# Patient Record
Sex: Female | Born: 1975 | Race: White | Hispanic: No | Marital: Single | Smoking: Never smoker
Health system: Southern US, Community
[De-identification: ages and names within clinical notes are randomized; demographics above are authoritative.]

## PROBLEM LIST (undated history)

## (undated) DIAGNOSIS — B182 Chronic viral hepatitis C: Secondary | ICD-10-CM

## (undated) HISTORY — PX: TUBAL LIGATION: SHX77

---

## 1999-01-28 ENCOUNTER — Inpatient Hospital Stay (HOSPITAL_COMMUNITY): Admission: AD | Admit: 1999-01-28 | Discharge: 1999-01-28 | Payer: Self-pay | Admitting: Obstetrics

## 1999-02-21 ENCOUNTER — Ambulatory Visit (HOSPITAL_COMMUNITY): Admission: RE | Admit: 1999-02-21 | Discharge: 1999-02-21 | Payer: Self-pay | Admitting: Obstetrics

## 1999-06-05 ENCOUNTER — Inpatient Hospital Stay (HOSPITAL_COMMUNITY): Admission: AD | Admit: 1999-06-05 | Discharge: 1999-06-05 | Payer: Self-pay | Admitting: *Deleted

## 1999-06-06 ENCOUNTER — Inpatient Hospital Stay (HOSPITAL_COMMUNITY): Admission: AD | Admit: 1999-06-06 | Discharge: 1999-06-06 | Payer: Self-pay | Admitting: *Deleted

## 1999-06-07 ENCOUNTER — Inpatient Hospital Stay (HOSPITAL_COMMUNITY): Admission: AD | Admit: 1999-06-07 | Discharge: 1999-06-07 | Payer: Self-pay | Admitting: Obstetrics & Gynecology

## 1999-06-07 ENCOUNTER — Encounter: Payer: Self-pay | Admitting: Obstetrics & Gynecology

## 1999-06-14 ENCOUNTER — Inpatient Hospital Stay (HOSPITAL_COMMUNITY): Admission: AD | Admit: 1999-06-14 | Discharge: 1999-06-14 | Payer: Self-pay | Admitting: Obstetrics & Gynecology

## 1999-06-22 ENCOUNTER — Inpatient Hospital Stay (HOSPITAL_COMMUNITY): Admission: AD | Admit: 1999-06-22 | Discharge: 1999-06-22 | Payer: Self-pay | Admitting: Obstetrics

## 1999-07-10 ENCOUNTER — Inpatient Hospital Stay (HOSPITAL_COMMUNITY): Admission: AD | Admit: 1999-07-10 | Discharge: 1999-07-10 | Payer: Self-pay | Admitting: *Deleted

## 1999-07-22 ENCOUNTER — Inpatient Hospital Stay (HOSPITAL_COMMUNITY): Admission: AD | Admit: 1999-07-22 | Discharge: 1999-07-24 | Payer: Self-pay | Admitting: Obstetrics

## 1999-10-30 ENCOUNTER — Inpatient Hospital Stay (HOSPITAL_COMMUNITY): Admission: AD | Admit: 1999-10-30 | Discharge: 1999-10-30 | Payer: Self-pay | Admitting: *Deleted

## 2014-07-26 ENCOUNTER — Encounter (HOSPITAL_COMMUNITY): Payer: Self-pay | Admitting: Emergency Medicine

## 2014-07-26 ENCOUNTER — Emergency Department (HOSPITAL_COMMUNITY)
Admission: EM | Admit: 2014-07-26 | Discharge: 2014-07-26 | Disposition: A | Discharge status: Home / Self Care | Payer: Self-pay | Attending: Emergency Medicine | Admitting: Emergency Medicine

## 2014-07-26 DIAGNOSIS — Z791 Long term (current) use of non-steroidal anti-inflammatories (NSAID): Secondary | ICD-10-CM | POA: Insufficient documentation

## 2014-07-26 DIAGNOSIS — R21 Rash and other nonspecific skin eruption: Secondary | ICD-10-CM | POA: Insufficient documentation

## 2014-07-26 DIAGNOSIS — G44209 Tension-type headache, unspecified, not intractable: Secondary | ICD-10-CM | POA: Insufficient documentation

## 2014-07-26 DIAGNOSIS — Z3202 Encounter for pregnancy test, result negative: Secondary | ICD-10-CM | POA: Insufficient documentation

## 2014-07-26 DIAGNOSIS — R5381 Other malaise: Secondary | ICD-10-CM | POA: Insufficient documentation

## 2014-07-26 DIAGNOSIS — Z8619 Personal history of other infectious and parasitic diseases: Secondary | ICD-10-CM | POA: Insufficient documentation

## 2014-07-26 DIAGNOSIS — R112 Nausea with vomiting, unspecified: Secondary | ICD-10-CM | POA: Insufficient documentation

## 2014-07-26 DIAGNOSIS — R42 Dizziness and giddiness: Secondary | ICD-10-CM | POA: Insufficient documentation

## 2014-07-26 DIAGNOSIS — R109 Unspecified abdominal pain: Secondary | ICD-10-CM | POA: Insufficient documentation

## 2014-07-26 DIAGNOSIS — R5383 Other fatigue: Secondary | ICD-10-CM

## 2014-07-26 DIAGNOSIS — N949 Unspecified condition associated with female genital organs and menstrual cycle: Secondary | ICD-10-CM | POA: Insufficient documentation

## 2014-07-26 HISTORY — DX: Chronic viral hepatitis C: B18.2

## 2014-07-26 LAB — URINALYSIS, ROUTINE W REFLEX MICROSCOPIC
Glucose, UA: NEGATIVE mg/dL
Hgb urine dipstick: NEGATIVE
Ketones, ur: 15 mg/dL — AB
Nitrite: NEGATIVE
Protein, ur: 30 mg/dL — AB
Specific Gravity, Urine: 1.025 (ref 1.005–1.030)
Urobilinogen, UA: 0.2 mg/dL (ref 0.0–1.0)
pH: 6 (ref 5.0–8.0)

## 2014-07-26 LAB — CBC WITH DIFFERENTIAL/PLATELET
Basophils Absolute: 0.1 10*3/uL (ref 0.0–0.1)
Basophils Relative: 2 % — ABNORMAL HIGH (ref 0–1)
Eosinophils Absolute: 0 10*3/uL (ref 0.0–0.7)
Eosinophils Relative: 0 % (ref 0–5)
HCT: 39.5 % (ref 36.0–46.0)
Hemoglobin: 13.8 g/dL (ref 12.0–15.0)
Lymphocytes Relative: 43 % (ref 12–46)
Lymphs Abs: 2.9 10*3/uL (ref 0.7–4.0)
MCH: 29.2 pg (ref 26.0–34.0)
MCHC: 34.9 g/dL (ref 30.0–36.0)
MCV: 83.7 fL (ref 78.0–100.0)
Monocytes Absolute: 0.5 10*3/uL (ref 0.1–1.0)
Monocytes Relative: 7 % (ref 3–12)
Neutro Abs: 3.4 10*3/uL (ref 1.7–7.7)
Neutrophils Relative %: 48 % (ref 43–77)
Platelets: 199 10*3/uL (ref 150–400)
RBC: 4.72 MIL/uL (ref 3.87–5.11)
RDW: 13.5 % (ref 11.5–15.5)
WBC: 6.8 10*3/uL (ref 4.0–10.5)

## 2014-07-26 LAB — COMPREHENSIVE METABOLIC PANEL
ALT: 23 U/L (ref 0–35)
AST: 30 U/L (ref 0–37)
Albumin: 3.7 g/dL (ref 3.5–5.2)
Alkaline Phosphatase: 86 U/L (ref 39–117)
Anion gap: 14 (ref 5–15)
BUN: 8 mg/dL (ref 6–23)
CO2: 25 mEq/L (ref 19–32)
Calcium: 8.4 mg/dL (ref 8.4–10.5)
Chloride: 101 mEq/L (ref 96–112)
Creatinine, Ser: 0.78 mg/dL (ref 0.50–1.10)
GFR calc Af Amer: >90 mL/min (ref >90)
GFR calc non Af Amer: >90 mL/min (ref >90)
Glucose, Bld: 97 mg/dL (ref 70–99)
Potassium: 3.7 mEq/L (ref 3.7–5.3)
Sodium: 140 mEq/L (ref 137–147)
Total Bilirubin: 0.7 mg/dL (ref 0.3–1.2)
Total Protein: 7.6 g/dL (ref 6.0–8.3)

## 2014-07-26 LAB — URINE MICROSCOPIC-ADD ON

## 2014-07-26 LAB — POC URINE PREG, ED: Preg Test, Ur: NEGATIVE

## 2014-07-26 LAB — LIPASE, BLOOD: Lipase: 23 U/L (ref 11–59)

## 2014-07-26 MED ORDER — MORPHINE SULFATE 4 MG/ML IJ SOLN
4.0000 mg | Freq: Once | INTRAMUSCULAR | Status: AC
  Administered 2014-07-26: 4 mg via INTRAVENOUS
  Filled 2014-07-26: qty 1

## 2014-07-26 MED ORDER — KETOROLAC TROMETHAMINE 30 MG/ML IJ SOLN
30.0000 mg | Freq: Once | INTRAMUSCULAR | Status: AC
  Administered 2014-07-26: 30 mg via INTRAVENOUS
  Filled 2014-07-26: qty 1

## 2014-07-26 MED ORDER — PROMETHAZINE HCL 25 MG PO TABS: 25.0000 mg | ORAL_TABLET | Freq: Four times a day (QID) | ORAL | Status: DC | PRN

## 2014-07-26 MED ORDER — METOCLOPRAMIDE HCL 5 MG/ML IJ SOLN
10.0000 mg | Freq: Once | INTRAMUSCULAR | Status: AC
  Administered 2014-07-26: 10 mg via INTRAVENOUS
  Filled 2014-07-26: qty 2

## 2014-07-26 MED ORDER — SODIUM CHLORIDE 0.9 % IV BOLUS (SEPSIS)
1000.0000 mL | Freq: Once | INTRAVENOUS | Status: AC
  Administered 2014-07-26: 1000 mL via INTRAVENOUS

## 2014-07-26 MED ORDER — ONDANSETRON HCL 4 MG/2ML IJ SOLN
4.0000 mg | Freq: Once | INTRAMUSCULAR | Status: AC
  Administered 2014-07-26: 4 mg via INTRAVENOUS
  Filled 2014-07-26: qty 2

## 2014-07-26 NOTE — ED Notes (Signed)
IV attempt x 2; second RN attempting

## 2014-07-26 NOTE — ED Notes (Addendum)
Pt c/o lower abdominal pain and NV x 3 days. Reports having menstral period but pain is different. Reports "too many times to count" of emesis; unable to keep food or fluids down. 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 Also c/o persistent frontal headache x 3 days. Rash also noted to centralized chest which started 3 days ago.

## 2014-07-26 NOTE — Progress Notes (Signed)
[facility] Community Liaison Stacy, ° °Will send patient a list of primary care resources and information on [facility] Card program to help patient establish primary care.  °

## 2014-07-26 NOTE — ED Notes (Addendum)
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

## 2014-07-26 NOTE — Discharge Instructions (Signed)
Phenergan for nausea as prescribed as needed. This medication may help you with headache. Follow up with primary care doctor for recheck. Return if worsening.    General Headache Without Cause A headache is pain or discomfort felt around the head or neck area. The specific cause of a headache may not be found. There are many causes and types of headaches. A few common ones are:  Tension headaches.  Migraine headaches.  Cluster headaches.  Chronic daily headaches. HOME CARE INSTRUCTIONS   Keep all follow-up appointments with your caregiver or any specialist referral.  Only take over-the-counter or prescription medicines for pain or discomfort as directed by your caregiver.  Lie down in a dark, quiet room when you have a headache.  Keep a headache journal to find out what may trigger your migraine headaches. For example, write down:  What you eat and drink.  How much sleep you get.  Any change to your diet or medicines.  Try massage or other relaxation techniques.  Put ice packs or heat on the head and neck. Use these 3 to 4 times per day for 15 to 20 minutes each time, or as needed.  Limit stress.  Sit up straight, and do not tense your muscles.  Quit smoking if you smoke.  Limit alcohol use.  Decrease the amount of caffeine you drink, or stop drinking caffeine.  Eat and sleep on a regular schedule.  Get 7 to 9 hours of sleep, or as recommended by your caregiver.  Keep lights dim if bright lights bother you and make your headaches worse. SEEK MEDICAL CARE IF:   You have problems with the medicines you were prescribed.  Your medicines are not working.  You have a change from the usual headache.  You have nausea or vomiting. SEEK IMMEDIATE MEDICAL CARE IF:   Your headache becomes severe.  You have a fever.  You have a stiff neck.  You have loss of vision.  You have muscular weakness or loss of muscle control.  You start losing your balance or have  trouble walking.  You feel faint or pass out.  You have severe symptoms that are different from your first symptoms. MAKE SURE YOU:   Understand these instructions.  Will watch your condition.  Will get help right away if you are not doing well or get worse. Document Released: 12/02/2005 Document Revised: 02/24/2012 Document Reviewed: 12/18/2011 [facility] Patient Information 2015 [place], [place]. This information is not intended to replace advice given to you by your health care provider. Make sure you discuss any questions you have with your health care provider.  Nausea and Vomiting Nausea is a sick feeling that often comes before throwing up (vomiting). Vomiting is a reflex where stomach contents come out of your mouth. Vomiting can cause severe loss of body fluids (dehydration). Children and elderly adults can become dehydrated quickly, especially if they also have diarrhea. Nausea and vomiting are symptoms of a condition or disease. It is important to find the cause of your symptoms. CAUSES   Direct irritation of the stomach lining. This irritation can result from increased acid production (gastroesophageal reflux disease), infection, food poisoning, taking certain medicines (such as nonsteroidal anti-inflammatory drugs), alcohol use, or tobacco use.  Signals from the brain.These signals could be caused by a headache, heat exposure, an inner ear disturbance, increased pressure in the brain from injury, infection, a tumor, or a concussion, pain, emotional stimulus, or metabolic problems.  An obstruction in the gastrointestinal tract (bowel obstruction).  Illnesses such as diabetes, hepatitis, gallbladder problems, appendicitis, kidney problems, cancer, sepsis, atypical symptoms of a heart attack, or eating disorders.  Medical treatments such as chemotherapy and radiation.  Receiving medicine that makes you sleep (general anesthetic) during surgery. DIAGNOSIS Your caregiver may ask  for tests to be done if the problems do not improve after a few days. Tests may also be done if symptoms are severe or if the reason for the nausea and vomiting is not clear. Tests may include:  Urine tests.  Blood tests.  Stool tests.  Cultures (to look for evidence of infection).  X-rays or other imaging studies. Test results can help your caregiver make decisions about treatment or the need for additional tests. TREATMENT You need to stay well hydrated. Drink frequently but in small amounts.You may wish to drink water, sports drinks, clear broth, or eat frozen ice pops or gelatin dessert to help stay hydrated.When you eat, eating slowly may help prevent nausea.There are also some antinausea medicines that may help prevent nausea. HOME CARE INSTRUCTIONS   Take all medicine as directed by your caregiver.  If you do not have an appetite, do not force yourself to eat. However, you must continue to drink fluids.  If you have an appetite, eat a normal diet unless your caregiver tells you differently.  Eat a variety of complex carbohydrates (rice, wheat, potatoes, bread), lean meats, yogurt, fruits, and vegetables.  Avoid high-fat foods because they are more difficult to digest.  Drink enough water and fluids to keep your urine clear or pale yellow.  If you are dehydrated, ask your caregiver for specific rehydration instructions. Signs of dehydration may include:  Severe thirst.  Dry lips and mouth.  Dizziness.  Dark urine.  Decreasing urine frequency and amount.  Confusion.  Rapid breathing or pulse. SEEK IMMEDIATE MEDICAL CARE IF:   You have blood or brown flecks (like coffee grounds) in your vomit.  You have black or bloody stools.  You have a severe headache or stiff neck.  You are confused.  You have severe abdominal pain.  You have chest pain or trouble breathing.  You do not urinate at least once every 8 hours.  You develop cold or clammy skin.  You  continue to vomit for longer than 24 to 48 hours.  You have a fever. MAKE SURE YOU:   Understand these instructions.  Will watch your condition.  Will get help right away if you are not doing well or get worse. Document Released: 12/02/2005 Document Revised: 02/24/2012 Document Reviewed: 05/01/2011 [facility] Patient Information 2015 [place]. This information is not intended to replace advice given to you by your health care provider. Make sure you discuss any questions you have with your health care provider.

## 2014-07-26 NOTE — ED Provider Notes (Signed)
CSN: [id number]     Arrival date & time 07/26/14  1320 History   First MD Initiated Contact with Patient 07/26/14 1423     Chief Complaint  Patient presents with  . Rash  . Emesis     (Consider location/radiation/quality/duration/timing/severity/associated sxs/prior Treatment) HPI Tara May is a 38 y.o. female who presents to ED with complaint of fatigue, nausea, vomiting, headache, weakness, rash to the chest for 3 days. States no hx of the same. No one in family is ill with the same. Denies fever. States unable to keep anything down. She states she has had rash on the chest for a little longer, has been putting hydrocortisone cream with some improvement. States she was also recently told there is mold in her office and she believes this could be what is causing her symptoms. She denies any nasal congestion, sore throat, cough, respiratory problems. States she is having lower abdominal cramping, but states that she started her period yesterday. Denies any urinary symptoms. No treatments tried. No other complaints.    Past Medical History  Diagnosis Date  . Hep C w/o coma, chronic    History reviewed. No pertinent past surgical history. History reviewed. No pertinent family history. History  Substance Use Topics  . Smoking status: Never Smoker   . Smokeless tobacco: Not on file  . Alcohol Use: No   OB History   Grav Para Term Preterm Abortions TAB SAB Ect Mult Living                 Review of Systems  Constitutional: Positive for fatigue. Negative for fever and chills.  HENT: Negative for congestion, sore throat and trouble swallowing.   Respiratory: Negative for cough, chest tightness and shortness of breath.   Cardiovascular: Negative for chest pain, palpitations and leg swelling.  Gastrointestinal: Positive for nausea, vomiting and abdominal pain. Negative for diarrhea.  Genitourinary: Positive for pelvic pain. Negative for dysuria, flank pain, vaginal bleeding, vaginal  discharge and vaginal pain.  Musculoskeletal: Negative for arthralgias, myalgias, neck pain and neck stiffness.  Skin: Negative for rash.  Neurological: Positive for weakness, light-headedness and headaches. Negative for dizziness and numbness.  All other systems reviewed and are negative.     Allergies  Review of patient's allergies indicates no known allergies.  Home Medications   Prior to Admission medications   Medication Sig Start Date End Date Taking? Authorizing Provider  ibuprofen (ADVIL,MOTRIN) 200 MG tablet Take 400 mg by mouth every 6 (six) hours as needed for moderate pain.   Yes Historical Provider, MD   BP 133/84  Pulse 106  Temp(Src) 98.5 F (36.9 C) (Oral)  Resp 18  Ht 5' (1.524 m)  Wt 155 lb (70.308 kg)  BMI 30.27 kg/m2  SpO2 95% Physical Exam  Nursing note and vitals reviewed. Constitutional: She appears well-developed and well-nourished. No distress.  HENT:  Head: Normocephalic.  Nose: Nose normal.  Mouth/Throat: Oropharynx is clear and moist.  Eyes: Conjunctivae and EOM are normal. Pupils are equal, round, and reactive to light.  Neck: Normal range of motion. Neck supple.  No meningismus  Cardiovascular: Normal rate, regular rhythm and normal heart sounds.   Pulmonary/Chest: Effort normal and breath sounds normal. No respiratory distress. She has no wheezes. She has no rales.  Abdominal: Soft. Bowel sounds are normal. She exhibits no distension. There is no tenderness. There is no rebound and no guarding.  Musculoskeletal: She exhibits no edema.  Neurological: She is alert.  Skin: Skin is  warm and dry.  Erythematous pappular rash to the anterior neck, upper chest, and under bilateral breast.   Psychiatric: She has a normal mood and affect. Her behavior is normal.    ED Course  Procedures (including critical care time) Labs Review Labs Reviewed  CBC WITH DIFFERENTIAL - Abnormal; Notable for the following:    Basophils Relative 2 (*)    All other  components within normal limits  URINALYSIS, ROUTINE W REFLEX MICROSCOPIC - Abnormal; Notable for the following:    Color, Urine AMBER (*)    Bilirubin Urine SMALL (*)    Ketones, ur 15 (*)    Protein, ur 30 (*)    Leukocytes, UA TRACE (*)    All other components within normal limits  COMPREHENSIVE METABOLIC PANEL  LIPASE, BLOOD  URINE MICROSCOPIC-ADD ON  POC URINE PREG, ED    Imaging Review No results found.   EKG Interpretation None      MDM   Final diagnoses:  Tension-type headache, not intractable, unspecified chronicity pattern  Non-intractable vomiting with nausea, vomiting of unspecified type   Pt with headache, nausea, vomiting, lower abdominal cramping. Also rash to the chest. Rash consistent with dermatitis possibly due to sweat? She denies any new jewelry or products. Pt has no abdominal tenderness on exam. Will get labs, start iv fluids, zofran.   4:32 PM No headache improvement with toradol. Nausea improved with zofran. Will try morphine and reglan. Pt receiving iv fluids. Will reassess.  5:13 PM Pt feeling slightly improved. On exam, no signs of meningismus. No neck pain or stiffness. She is tolerating POs. Home with phenergan and follow up. Suspect most likely viral gastroenteritis.  VS normal. Stable for d/c home.   Filed Vitals:   07/26/14 1325 07/26/14 1516 07/26/14 1645  BP: 133/84 110/73 108/59  Pulse: 106 89 84  Temp: 98.5 F (36.9 C)    TempSrc: Oral    Resp: 18 18 16   Height: 5' (1.524 m)    Weight: 155 lb (70.308 kg)    SpO2: 95% 96% 97%     Lottie Mussel, PA-C 07/26/14 1715

## 2014-07-28 NOTE — ED Provider Notes (Signed)
Medical screening examination/treatment/procedure(s) were performed by non-physician practitioner and as supervising physician I was immediately available for consultation/collaboration.   EKG Interpretation None       Giovanny Dugal, MD 07/28/14 0704 

## 2014-08-01 ENCOUNTER — Encounter (HOSPITAL_COMMUNITY): Payer: Self-pay | Admitting: Emergency Medicine

## 2014-08-01 ENCOUNTER — Emergency Department (HOSPITAL_COMMUNITY): Payer: Self-pay

## 2014-08-01 ENCOUNTER — Emergency Department (HOSPITAL_COMMUNITY)
Admission: EM | Admit: 2014-08-01 | Discharge: 2014-08-02 | Disposition: A | Discharge status: Home / Self Care | Payer: Self-pay | Attending: Emergency Medicine | Admitting: Emergency Medicine

## 2014-08-01 DIAGNOSIS — J3489 Other specified disorders of nose and nasal sinuses: Secondary | ICD-10-CM | POA: Insufficient documentation

## 2014-08-01 DIAGNOSIS — Z3202 Encounter for pregnancy test, result negative: Secondary | ICD-10-CM | POA: Insufficient documentation

## 2014-08-01 DIAGNOSIS — R112 Nausea with vomiting, unspecified: Secondary | ICD-10-CM | POA: Insufficient documentation

## 2014-08-01 DIAGNOSIS — Z8719 Personal history of other diseases of the digestive system: Secondary | ICD-10-CM | POA: Insufficient documentation

## 2014-08-01 DIAGNOSIS — Z8619 Personal history of other infectious and parasitic diseases: Secondary | ICD-10-CM | POA: Insufficient documentation

## 2014-08-01 DIAGNOSIS — Z79899 Other long term (current) drug therapy: Secondary | ICD-10-CM | POA: Insufficient documentation

## 2014-08-01 DIAGNOSIS — R519 Headache, unspecified: Secondary | ICD-10-CM

## 2014-08-01 DIAGNOSIS — R51 Headache: Secondary | ICD-10-CM | POA: Insufficient documentation

## 2014-08-01 DIAGNOSIS — R21 Rash and other nonspecific skin eruption: Secondary | ICD-10-CM | POA: Insufficient documentation

## 2014-08-01 DIAGNOSIS — R630 Anorexia: Secondary | ICD-10-CM | POA: Insufficient documentation

## 2014-08-01 DIAGNOSIS — M542 Cervicalgia: Secondary | ICD-10-CM | POA: Insufficient documentation

## 2014-08-01 DIAGNOSIS — IMO0001 Reserved for inherently not codable concepts without codable children: Secondary | ICD-10-CM | POA: Insufficient documentation

## 2014-08-01 LAB — COMPREHENSIVE METABOLIC PANEL
ALK PHOS: 108 U/L (ref 39–117)
ALT: 39 U/L — AB (ref 0–35)
AST: 48 U/L — AB (ref 0–37)
Albumin: 2.9 g/dL — ABNORMAL LOW (ref 3.5–5.2)
Anion gap: 13 (ref 5–15)
BILIRUBIN TOTAL: 0.5 mg/dL (ref 0.3–1.2)
BUN: 7 mg/dL (ref 6–23)
CHLORIDE: 100 meq/L (ref 96–112)
CO2: 23 meq/L (ref 19–32)
Calcium: 8.3 mg/dL — ABNORMAL LOW (ref 8.4–10.5)
Creatinine, Ser: 0.78 mg/dL (ref 0.50–1.10)
GLUCOSE: 107 mg/dL — AB (ref 70–99)
Potassium: 4 mEq/L (ref 3.7–5.3)
SODIUM: 136 meq/L — AB (ref 137–147)
Total Protein: 6.5 g/dL (ref 6.0–8.3)

## 2014-08-01 LAB — CBC WITH DIFFERENTIAL/PLATELET
BASOS PCT: 3 % — AB (ref 0–1)
Basophils Absolute: 0.4 10*3/uL — ABNORMAL HIGH (ref 0.0–0.1)
EOS PCT: 0 % (ref 0–5)
Eosinophils Absolute: 0 10*3/uL (ref 0.0–0.7)
HCT: 38.5 % (ref 36.0–46.0)
Hemoglobin: 13.3 g/dL (ref 12.0–15.0)
Lymphocytes Relative: 62 % — ABNORMAL HIGH (ref 12–46)
Lymphs Abs: 7.9 10*3/uL — ABNORMAL HIGH (ref 0.7–4.0)
MCH: 29.3 pg (ref 26.0–34.0)
MCHC: 34.5 g/dL (ref 30.0–36.0)
MCV: 84.8 fL (ref 78.0–100.0)
MONOS PCT: 9 % (ref 3–12)
Monocytes Absolute: 1.2 10*3/uL — ABNORMAL HIGH (ref 0.1–1.0)
NEUTROS PCT: 26 % — AB (ref 43–77)
Neutro Abs: 3.4 10*3/uL (ref 1.7–7.7)
PLATELETS: 231 10*3/uL (ref 150–400)
RBC: 4.54 MIL/uL (ref 3.87–5.11)
RDW: 14.5 % (ref 11.5–15.5)
WBC: 12.9 10*3/uL — AB (ref 4.0–10.5)

## 2014-08-01 LAB — I-STAT CG4 LACTIC ACID, ED: Lactic Acid, Venous: 1.11 mmol/L (ref 0.5–2.2)

## 2014-08-01 MED ORDER — SODIUM CHLORIDE 0.9 % IV BOLUS (SEPSIS)
1000.0000 mL | Freq: Once | INTRAVENOUS | Status: AC
  Administered 2014-08-02: 1000 mL via INTRAVENOUS

## 2014-08-01 MED ORDER — SODIUM CHLORIDE 0.9 % IV BOLUS (SEPSIS)
1000.0000 mL | Freq: Once | INTRAVENOUS | Status: AC
  Administered 2014-08-01: 1000 mL via INTRAVENOUS

## 2014-08-01 MED ORDER — DIPHENHYDRAMINE HCL 50 MG/ML IJ SOLN
25.0000 mg | Freq: Once | INTRAMUSCULAR | Status: AC
  Administered 2014-08-01: 25 mg via INTRAVENOUS
  Filled 2014-08-01: qty 1

## 2014-08-01 MED ORDER — ONDANSETRON HCL 4 MG/2ML IJ SOLN
4.0000 mg | Freq: Once | INTRAMUSCULAR | Status: AC
  Administered 2014-08-01: 4 mg via INTRAVENOUS
  Filled 2014-08-01: qty 2

## 2014-08-01 MED ORDER — METOCLOPRAMIDE HCL 5 MG/ML IJ SOLN
10.0000 mg | Freq: Once | INTRAMUSCULAR | Status: AC
  Administered 2014-08-01: 10 mg via INTRAVENOUS
  Filled 2014-08-01: qty 2

## 2014-08-01 NOTE — ED Notes (Signed)
Pt c/o headache, fever and rash to upper chest. Pt reports headache is in posterior head described as pressure. Pt states she had a rash on chest when she came to visit on 8/11 but has since gotten worse. Pt states she has been trying otc ibuprofen with no relief.

## 2014-08-01 NOTE — ED Notes (Signed)
Patient here with complaint of fever and headache which began about 10 days ago. States that she was seen for these symptoms about a week ago and was discharged with phenergan. States that the headache has been unrelenting since that time and her fevers have continued.

## 2014-08-01 NOTE — ED Provider Notes (Signed)
MSE was initiated and I personally evaluated the patient and placed orders (if any) at  10:32 PM on August 01, 2014.  The patient appears stable so that the remainder of the MSE may be completed by another provider.   Joya Gaskinsonald W [employer], MD 08/01/14 2232

## 2014-08-01 NOTE — ED Notes (Signed)
Patient transported to CT 

## 2014-08-01 NOTE — ED Provider Notes (Signed)
CSN: [id number]     Arrival date & time 08/01/14  1907 History   First MD Initiated Contact with Patient 08/01/14 2232     Chief Complaint  Patient presents with  . Fever  . Headache     (Consider location/radiation/quality/duration/timing/severity/associated sxs/prior Treatment) HPI Comments: Patient reports 10 day history of fever and headache. She was seen in the emergency department on August 11 with headache, rash, fever, nausea and vomiting and diagnosed with gastroenteritis. She reports the headache persists it is constant in the back of her head. Nothing makes it better or worse. She one episode of vomiting today fever up to 101. She denies any photophobia or phonophobia. Denies any cough, chest pain or shortness of breath. No abdominal pain or back pain. Denies any recent travel. Denies any recent camping trips or tick bites. 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 She endorses an itchy red rash on her chest between her breasts it is getting worse. No focal weakness, numbness or tingling. No visual changes.  She reports she returns today because the headache is persistent and the rash is getting worse. Denies any IV drug abuse.  The history is provided by the patient.    Past Medical History  Diagnosis Date  . Hep C w/o coma, chronic    History reviewed. No pertinent past surgical history. History reviewed. No pertinent family history. History  Substance Use Topics  . Smoking status: Never Smoker   . Smokeless tobacco: Not on file  . Alcohol Use: No   OB History   Grav Para Term Preterm Abortions TAB SAB Ect Mult Living                 Review of Systems  Constitutional: Positive for fever, activity change and appetite change. Negative for fatigue.  HENT: Positive for congestion.   Eyes: Negative for visual disturbance.  Respiratory: Negative for cough, chest tightness and shortness of breath.   Cardiovascular: Negative for chest pain.   Gastrointestinal: Positive for nausea and vomiting. Negative for abdominal pain.  Genitourinary: Negative for dysuria and hematuria.  Musculoskeletal: Positive for arthralgias, myalgias and neck pain.  Skin: Positive for rash.  Neurological: Positive for headaches. Negative for tremors and weakness.  A complete 10 system review of systems was obtained and all systems are negative except as noted in the HPI and PMH.      Allergies  Review of patient's allergies indicates no known allergies.  Home Medications   Prior to Admission medications   Medication Sig Start Date End Date Taking? Authorizing Provider  ibuprofen (ADVIL,MOTRIN) 200 MG tablet Take 400 mg by mouth every 6 (six) hours as needed for moderate pain.   Yes Historical Provider, MD  OVER THE COUNTER MEDICATION Take 1 tablet by mouth daily as needed (for headaches).   Yes Historical Provider, MD  promethazine (PHENERGAN) 25 MG tablet Take 1 tablet (25 mg total) by mouth every 6 (six) hours as needed for nausea or vomiting. 07/26/14  Yes Tatyana A Kirichenko, PA-C  doxycycline (VIBRAMYCIN) 100 MG capsule Take 1 capsule (100 mg total) by mouth 2 (two) times daily. 08/02/14   Glynn Octave, MD  hydrocortisone cream 1 % Apply to affected area 2 times daily 08/02/14   Glynn Octave, MD  ondansetron (ZOFRAN) 4 MG tablet Take 1 tablet (4 mg total) by mouth every 6 (six) hours. 08/02/14   Glynn Octave, MD   BP 107/62  Pulse 100  Temp(Src) 99.1 F (37.3  C) (Oral)  Resp 24  Ht 5' (1.524 m)  Wt 150 lb (68.04 kg)  BMI 29.30 kg/m2  SpO2 97%  LMP 07/25/2014 Physical Exam  Nursing note and vitals reviewed. Constitutional: She is oriented to person, place, and time. She appears well-developed and well-nourished. No distress.  Dry mucous membranes  HENT:  Head: Normocephalic and atraumatic.  Mouth/Throat: Oropharynx is clear and moist. No oropharyngeal exudate.  Eyes: Conjunctivae and EOM are normal. Pupils are equal, round, and  reactive to light.  Neck: Normal range of motion.  Stiffness with ROM of neck.  Cardiovascular: Normal rate, normal heart sounds and intact distal pulses.   No murmur heard. Pulmonary/Chest: Effort normal and breath sounds normal. No respiratory distress.  Abdominal: Soft. There is no tenderness. There is no rebound and no guarding.  Musculoskeletal: Normal range of motion. She exhibits no edema and no tenderness.  Neurological: She is alert and oriented to person, place, and time. No cranial nerve deficit. She exhibits normal muscle tone. Coordination normal.  No ataxia on finger to nose bilaterally. No pronator drift. 5/5 strength throughout. CN 2-12 intact. Negative Romberg. Equal grip strength. Sensation intact. Gait is normal.   Skin: Skin is warm. Rash noted.  Maculopapular rash between breasts with excoriations.  No rash to palms, soles, genitals  Psychiatric: She has a normal mood and affect. Her behavior is normal.    ED Course  Procedures (including critical care time) Labs Review Labs Reviewed  CBC WITH DIFFERENTIAL - Abnormal; Notable for the following:    WBC 12.9 (*)    Neutrophils Relative % 26 (*)    Lymphocytes Relative 62 (*)    Basophils Relative 3 (*)    Lymphs Abs 7.9 (*)    Monocytes Absolute 1.2 (*)    Basophils Absolute 0.4 (*)    All other components within normal limits  COMPREHENSIVE METABOLIC PANEL - Abnormal; Notable for the following:    Sodium 136 (*)    Glucose, Bld 107 (*)    Calcium 8.3 (*)    Albumin 2.9 (*)    AST 48 (*)    ALT 39 (*)    All other components within normal limits  CSF CELL COUNT WITH DIFFERENTIAL - Abnormal; Notable for the following:    RBC Count, CSF 1 (*)    All other components within normal limits  CSF CELL COUNT WITH DIFFERENTIAL - Abnormal; Notable for the following:    RBC Count, CSF 2 (*)    All other components within normal limits  GRAM STAIN  CULTURE, BLOOD (ROUTINE X 2)  CULTURE, BLOOD (ROUTINE X 2)  CSF  CULTURE  PREGNANCY, URINE  PROTEIN AND GLUCOSE, CSF  URINALYSIS, ROUTINE W REFLEX MICROSCOPIC  HERPES SIMPLEX VIRUS(HSV) DNA BY PCR  I-STAT CG4 LACTIC ACID, ED  I-STAT CG4 LACTIC ACID, ED    Imaging Review Dg Chest 2 View  08/01/2014   CLINICAL DATA:  Fever and headache.  EXAM: CHEST  2 VIEW  COMPARISON:  None.  FINDINGS: There is a linear opacity in the lingula consistent with atelectasis. Increased markings in the left lower lobe which are more indistinct, with possible air bronchogram. Suspect trace left pleural effusion based on posterior costophrenic sulcus blunting. The right chest is normal. Normal heart size upper mediastinal contours.  IMPRESSION: Left basilar atelectasis.  There may be associated bronchopneumonia.   Electronically Signed   By: Tiburcio PeaJonathan  Watts M.D.   On: 08/01/2014 23:44   Ct Head Wo Contrast  08/01/2014  CLINICAL DATA:  Fever and headache  EXAM: CT HEAD WITHOUT CONTRAST  TECHNIQUE: Contiguous axial images were obtained from the base of the skull through the vertex without intravenous contrast.  COMPARISON:  None.  FINDINGS: Skull and Sinuses:Negative for fracture or destructive process. The mastoids, middle ears, and imaged paranasal sinuses are clear.  Orbits: No acute abnormality.  Brain: No evidence of acute abnormality, such as acute infarction, hemorrhage, hydrocephalus, or mass lesion/mass effect.  IMPRESSION: Negative head CT.   Electronically Signed   By: Tiburcio Pea M.D.   On: 08/01/2014 23:42     EKG Interpretation None      MDM   Final diagnoses:  Headache, unspecified headache type   Ten day history of fever, headache and rash. Patient states headache is worsening and rash is worsening to chest. No new exposures.  Nonfocal neurological exam. No frank meningismus. CT head negative.  Rash is nonspecific in appearance. Does not appear to be meningococcemia or tick exposure.  LP results negative. No evidence of meningitis. Cultures  pending.  Headache improved with treatment in the ED. Heart rate improved to 100. Patient tolerating by mouth and ambulatory.  No vomiting in the ED. Questionable infiltrate on chest x-ray. Patient does endorse cough. We'll cover for possible community acquired pneumonia  With doxycycline. This should cover any possible tickborne illness as well.  Patient is tolerating by mouth. She is ambulatory. She is instructed to use hydrocortisone cream as needed for the nonspecific rash between her breasts. [facility]up with [facility]. Return precautions discussed.  BP 107/62  Pulse 100  Temp(Src) 99.1 F (37.3 C) (Oral)  Resp 24  Ht 5' (1.524 m)  Wt 150 lb (68.04 kg)  BMI 29.30 kg/m2  SpO2 97%  LMP 07/25/2014   Glynn Octave, MD 08/02/14 [phone]

## 2014-08-02 LAB — CSF CELL COUNT WITH DIFFERENTIAL
RBC COUNT CSF: 1 /mm3 — AB
RBC Count, CSF: 2 /mm3 — ABNORMAL HIGH
TUBE #: 1
TUBE #: 4
WBC, CSF: 2 /mm3 (ref 0–5)
WBC, CSF: 4 /mm3 (ref 0–5)

## 2014-08-02 LAB — URINE MICROSCOPIC-ADD ON

## 2014-08-02 LAB — PREGNANCY, URINE: Preg Test, Ur: NEGATIVE

## 2014-08-02 LAB — PROTEIN AND GLUCOSE, CSF
GLUCOSE CSF: 59 mg/dL (ref 43–76)
TOTAL PROTEIN, CSF: 22 mg/dL (ref 15–45)

## 2014-08-02 LAB — URINALYSIS, ROUTINE W REFLEX MICROSCOPIC
Glucose, UA: NEGATIVE mg/dL
Hgb urine dipstick: NEGATIVE
Ketones, ur: 15 mg/dL — AB
LEUKOCYTES UA: NEGATIVE
NITRITE: POSITIVE — AB
PH: 6 (ref 5.0–8.0)
Protein, ur: 30 mg/dL — AB
SPECIFIC GRAVITY, URINE: 1.02 (ref 1.005–1.030)
Urobilinogen, UA: 0.2 mg/dL (ref 0.0–1.0)

## 2014-08-02 LAB — HERPES SIMPLEX VIRUS(HSV) DNA BY PCR
HSV 1 DNA: NOT DETECTED
HSV 2 DNA: NOT DETECTED

## 2014-08-02 LAB — GRAM STAIN: Special Requests: NORMAL

## 2014-08-02 LAB — I-STAT CG4 LACTIC ACID, ED: LACTIC ACID, VENOUS: 1.42 mmol/L (ref 0.5–2.2)

## 2014-08-02 LAB — PATHOLOGIST SMEAR REVIEW

## 2014-08-02 MED ORDER — DOXYCYCLINE HYCLATE 100 MG PO CAPS: 100.0000 mg | ORAL_CAPSULE | Freq: Two times a day (BID) | ORAL | Status: DC

## 2014-08-02 MED ORDER — ONDANSETRON HCL 4 MG PO TABS: 4.0000 mg | ORAL_TABLET | Freq: Four times a day (QID) | ORAL | Status: DC

## 2014-08-02 MED ORDER — MORPHINE SULFATE 4 MG/ML IJ SOLN
4.0000 mg | Freq: Once | INTRAMUSCULAR | Status: AC
  Administered 2014-08-02: 4 mg via INTRAVENOUS
  Filled 2014-08-02: qty 1

## 2014-08-02 MED ORDER — HYDROCORTISONE 1 % EX CREA: TOPICAL_CREAM | CUTANEOUS | Status: DC

## 2014-08-02 MED ORDER — ONDANSETRON HCL 4 MG/2ML IJ SOLN
4.0000 mg | Freq: Once | INTRAMUSCULAR | Status: AC
  Administered 2014-08-02: 4 mg via INTRAVENOUS
  Filled 2014-08-02: qty 2

## 2014-08-02 NOTE — ED Provider Notes (Signed)
I saw and evaluated the patient, reviewed the resident's note and I agree with the findings and plan. If applicable, I agree with the resident's interpretation of the EKG.  If applicable, I was present for critical portions of any procedures performed.    Glynn OctaveStephen Mckensie Scotti, MD 08/02/14 [phone]

## 2014-08-02 NOTE — ED Provider Notes (Signed)
LUMBAR PUNCTURE Date/Time: 08/02/2014 12:11 AM Performed by: Gavin PoundBROOTEN, Ulices Maack Authorized by: Glynn OctaveANCOUR, STEPHEN Consent: Verbal consent obtained. written consent obtained. Risks and benefits: risks, benefits and alternatives were discussed Consent given by: patient Required items: required blood products, implants, devices, and special equipment available Patient identity confirmed: verbally with patient and arm band Time out: Immediately prior to procedure a "time out" was called to verify the correct patient, procedure, equipment, support staff and site/side marked as required. Indications: evaluation for infection Anesthesia: local infiltration Local anesthetic: lidocaine 1% without epinephrine Anesthetic total: 2 ml Preparation: Patient was prepped and draped in the usual sterile fashion. Lumbar space: L3-L4 interspace Patient's position: sitting Needle gauge: 22 Needle type: spinal needle - Quincke tip Needle length: 3.5 in Number of attempts: 1 Fluid appearance: clear Tubes of fluid: 4 Total volume: 4 ml Post-procedure: site cleaned and adhesive bandage applied Patient tolerance: Patient tolerated the procedure well with no immediate complications.    Gavin PoundJustin Noal Abshier, MD 08/02/14 [phone]

## 2014-08-02 NOTE — ED Notes (Signed)
Pt ambulated in hall with no assistance. Pt also was able to keep drink down.

## 2014-08-02 NOTE — Discharge Instructions (Signed)
General Headache Without Cause Follow up with the wellness center. Return to the ED if you develop new or worsening symptoms. A headache is pain or discomfort felt around the head or neck area. The specific cause of a headache may not be found. There are many causes and types of headaches. A few common ones are:  Tension headaches.  Migraine headaches.  Cluster headaches.  Chronic daily headaches. HOME CARE INSTRUCTIONS   Keep all follow-up appointments with your caregiver or any specialist referral.  Only take over-the-counter or prescription medicines for pain or discomfort as directed by your caregiver.  Lie down in a dark, quiet room when you have a headache.  Keep a headache journal to find out what may trigger your migraine headaches. For example, write down:  What you eat and drink.  How much sleep you get.  Any change to your diet or medicines.  Try massage or other relaxation techniques.  Put ice packs or heat on the head and neck. Use these 3 to 4 times per day for 15 to 20 minutes each time, or as needed.  Limit stress.  Sit up straight, and do not tense your muscles.  Quit smoking if you smoke.  Limit alcohol use.  Decrease the amount of caffeine you drink, or stop drinking caffeine.  Eat and sleep on a regular schedule.  Get 7 to 9 hours of sleep, or as recommended by your caregiver.  Keep lights dim if bright lights bother you and make your headaches worse. SEEK MEDICAL CARE IF:   You have problems with the medicines you were prescribed.  Your medicines are not working.  You have a change from the usual headache.  You have nausea or vomiting. SEEK IMMEDIATE MEDICAL CARE IF:   Your headache becomes severe.  You have a fever.  You have a stiff neck.  You have loss of vision.  You have muscular weakness or loss of muscle control.  You start losing your balance or have trouble walking.  You feel faint or pass out.  You have severe  symptoms that are different from your first symptoms. MAKE SURE YOU:   Understand these instructions.  Will watch your condition.  Will get help right away if you are not doing well or get worse. Document Released: 12/02/2005 Document Revised: 02/24/2012 Document Reviewed: 12/18/2011 [facility] Patient Information 2015 [place], [place]. This information is not intended to replace advice given to you by your health care provider. Make sure you discuss any questions you have with your health care provider.

## 2014-08-02 NOTE — ED Notes (Signed)
Discharge instructions reviewed with pt. Pt verbalized understanding.   

## 2014-08-05 LAB — CSF CULTURE
CULTURE: NO GROWTH
Special Requests: NORMAL

## 2014-08-05 LAB — CSF CULTURE W GRAM STAIN

## 2014-08-08 LAB — CULTURE, BLOOD (ROUTINE X 2): Culture: NO GROWTH

## 2014-08-12 ENCOUNTER — Ambulatory Visit: Payer: Self-pay | Admitting: Family Medicine

## 2015-06-06 ENCOUNTER — Emergency Department (HOSPITAL_COMMUNITY)
Admission: EM | Admit: 2015-06-06 | Discharge: 2015-06-06 | Disposition: A | Discharge status: Home / Self Care | Payer: Self-pay | Attending: Emergency Medicine | Admitting: Emergency Medicine

## 2015-06-06 ENCOUNTER — Encounter (HOSPITAL_COMMUNITY): Payer: Self-pay | Admitting: Emergency Medicine

## 2015-06-06 DIAGNOSIS — J069 Acute upper respiratory infection, unspecified: Secondary | ICD-10-CM | POA: Insufficient documentation

## 2015-06-06 DIAGNOSIS — Z8619 Personal history of other infectious and parasitic diseases: Secondary | ICD-10-CM | POA: Insufficient documentation

## 2015-06-06 DIAGNOSIS — R1110 Vomiting, unspecified: Secondary | ICD-10-CM | POA: Insufficient documentation

## 2015-06-06 DIAGNOSIS — H748X9 Other specified disorders of middle ear and mastoid, unspecified ear: Secondary | ICD-10-CM | POA: Insufficient documentation

## 2015-06-06 DIAGNOSIS — Z79899 Other long term (current) drug therapy: Secondary | ICD-10-CM | POA: Insufficient documentation

## 2015-06-06 MED ORDER — LORATADINE 10 MG PO TABS: 10.0000 mg | ORAL_TABLET | Freq: Every day | ORAL | Status: DC

## 2015-06-06 NOTE — ED Notes (Signed)
Declined W/C at D/C and was escorted to lobby by RN. 

## 2015-06-06 NOTE — ED Notes (Signed)
Pt started 4 days ago with cough, nasal congestion, chills. States has cough "till I threw up". Developed sore throat "from coughing so much". Reports "green nasal drainage".

## 2015-06-06 NOTE — Discharge Instructions (Signed)
Upper Respiratory Infection, Adult An upper respiratory infection (URI) is also sometimes known as the common cold. The upper respiratory tract includes the nose, sinuses, throat, trachea, and bronchi. Bronchi are the airways leading to the lungs. Most people improve within 1 week, but symptoms can last up to 2 weeks. A residual cough may last even longer.  CAUSES Many different viruses can infect the tissues lining the upper respiratory tract. The tissues become irritated and inflamed and often become very moist. Mucus production is also common. A cold is contagious. You can easily spread the virus to others by oral contact. This includes kissing, sharing a glass, coughing, or sneezing. Touching your mouth or nose and then touching a surface, which is then touched by another person, can also spread the virus. SYMPTOMS  Symptoms typically develop 1 to 3 days after you come in contact with a cold virus. Symptoms vary from person to person. They may include:  Runny nose.  Sneezing.  Nasal congestion.  Sinus irritation.  Sore throat.  Loss of voice (laryngitis).  Cough.  Fatigue.  Muscle aches.  Loss of appetite.  Headache.  Low-grade fever. DIAGNOSIS  You might diagnose your own cold based on familiar symptoms, since most people get a cold 2 to 3 times a year. Your caregiver can confirm this based on your exam. Most importantly, your caregiver can check that your symptoms are not due to another disease such as strep throat, sinusitis, pneumonia, asthma, or epiglottitis. Blood tests, throat tests, and X-rays are not necessary to diagnose a common cold, but they may sometimes be helpful in excluding other more serious diseases. Your caregiver will decide if any further tests are required. RISKS AND COMPLICATIONS  You may be at risk for a more severe case of the common cold if you smoke cigarettes, have chronic heart disease (such as heart failure) or lung disease (such as asthma), or if  you have a weakened immune system. The very young and very old are also at risk for more serious infections. Bacterial sinusitis, middle ear infections, and bacterial pneumonia can complicate the common cold. The common cold can worsen asthma and chronic obstructive pulmonary disease (COPD). Sometimes, these complications can require emergency medical care and may be life-threatening. PREVENTION  The best way to protect against getting a cold is to practice good hygiene. Avoid oral or hand contact with people with cold symptoms. Wash your hands often if contact occurs. There is no clear evidence that vitamin C, vitamin E, echinacea, or exercise reduces the chance of developing a cold. However, it is always recommended to get plenty of rest and practice good nutrition. TREATMENT  Treatment is directed at relieving symptoms. There is no cure. Antibiotics are not effective, because the infection is caused by a virus, not by bacteria. Treatment may include:  Increased fluid intake. Sports drinks offer valuable electrolytes, sugars, and fluids.  Breathing heated mist or steam (vaporizer or shower).  Eating chicken soup or other clear broths, and maintaining good nutrition.  Getting plenty of rest.  Using gargles or lozenges for comfort.  Controlling fevers with ibuprofen or acetaminophen as directed by your caregiver.  Increasing usage of your inhaler if you have asthma. Zinc gel and zinc lozenges, taken in the first 24 hours of the common cold, can shorten the duration and lessen the severity of symptoms. Pain medicines may help with fever, muscle aches, and throat pain. A variety of non-prescription medicines are available to treat congestion and runny nose. Your caregiver   can make recommendations and may suggest nasal or lung inhalers for other symptoms.  HOME CARE INSTRUCTIONS   Only take over-the-counter or prescription medicines for pain, discomfort, or fever as directed by your  caregiver.  Use a warm mist humidifier or inhale steam from a shower to increase air moisture. This may keep secretions moist and make it easier to breathe.  Drink enough water and fluids to keep your urine clear or pale yellow.  Rest as needed.  Return to work when your temperature has returned to normal or as your caregiver advises. You may need to stay home longer to avoid infecting others. You can also use a face mask and careful hand washing to prevent spread of the virus. SEEK MEDICAL CARE IF:   After the first few days, you feel you are getting worse rather than better.  You need your caregiver's advice about medicines to control symptoms.  You develop chills, worsening shortness of breath, or brown or red sputum. These may be signs of pneumonia.  You develop yellow or brown nasal discharge or pain in the face, especially when you bend forward. These may be signs of sinusitis.  You develop a fever, swollen neck glands, pain with swallowing, or white areas in the back of your throat. These may be signs of strep throat. SEEK IMMEDIATE MEDICAL CARE IF:   You have a fever.  You develop severe or persistent headache, ear pain, sinus pain, or chest pain.  You develop wheezing, a prolonged cough, cough up blood, or have a change in your usual mucus (if you have chronic lung disease).  You develop sore muscles or a stiff neck. Document Released: 05/28/2001 Document Revised: 02/24/2012 Document Reviewed: 03/09/2014 [employer] Patient Information 2015 [employer]. This information is not intended to replace advice given to you by your health care provider. Make sure you discuss any questions you have with your health care provider.  

## 2015-06-06 NOTE — ED Provider Notes (Signed)
CSN: [id number]     Arrival date & time 06/06/15  0927 History  This chart was scribed for non-physician practitioner Danelle Berry, PA-C working with Toy Cookey, MD by Murriel Hopper, ED Scribe. This patient was seen in room TR07C/TR07C and the patient's care was started at 10:30 AM.    Chief Complaint  Patient presents with  . Cough    The history is provided by the patient. No language interpreter was used.    HPI Comments: Tara May is a 39 y.o. female, otherwise healthy, who presents to the ED for 4 days of cough, runny nose, sore throat and one episode of vomiting a few days ago.  Pt states she has tried to treat with dayquil/Nyquil, but without much relief.  She has not vomited in the last several days, but has spit up some clear to green phlem with coughing fits.  She has not had any fever, chills, sweats, SOB, wheeze, CP, headache, ear pain, nausea, rash, muscle aches or fatigue and has not other complaints at this time.   Past Medical History  Diagnosis Date  . Hep C w/o coma, chronic    Past Surgical History  Procedure Laterality Date  . Tubal ligation     No family history on file. History  Substance Use Topics  . Smoking status: Never Smoker   . Smokeless tobacco: Not on file  . Alcohol Use: No   OB History    No data available     Review of Systems  HENT: Positive for rhinorrhea and sore throat.   Respiratory: Negative for shortness of breath and wheezing.   Cardiovascular: Negative for chest pain.  Gastrointestinal: Positive for vomiting. Negative for diarrhea.  Musculoskeletal: Negative for neck pain.  Neurological: Negative for headaches.    Allergies  Review of patient's allergies indicates no known allergies.  Home Medications   Prior to Admission medications   Medication Sig Start Date End Date Taking? Authorizing Provider  doxycycline (VIBRAMYCIN) 100 MG capsule Take 1 capsule (100 mg total) by mouth 2 (two) times daily. 08/02/14   Glynn Octave, MD  hydrocortisone cream 1 % Apply to affected area 2 times daily 08/02/14   Glynn Octave, MD  ibuprofen (ADVIL,MOTRIN) 200 MG tablet Take 400 mg by mouth every 6 (six) hours as needed for moderate pain.    Historical Provider, MD  loratadine (CLARITIN) 10 MG tablet Take 1 tablet (10 mg total) by mouth daily. 06/06/15   Danelle Berry, PA-C  ondansetron (ZOFRAN) 4 MG tablet Take 1 tablet (4 mg total) by mouth every 6 (six) hours. 08/02/14   Glynn Octave, MD  OVER THE COUNTER MEDICATION Take 1 tablet by mouth daily as needed (for headaches).    Historical Provider, MD  promethazine (PHENERGAN) 25 MG tablet Take 1 tablet (25 mg total) by mouth every 6 (six) hours as needed for nausea or vomiting. 07/26/14   Tatyana Kirichenko, PA-C   BP 118/82 mmHg  Pulse 77  Temp(Src) 97.9 F (36.6 C) (Oral)  Resp 18  Ht 5' (1.524 m)  Wt 165 lb (74.844 kg)  BMI 32.22 kg/m2  SpO2 99%  LMP 05/17/2015 Physical Exam  Constitutional: She is oriented to person, place, and time. She appears well-developed and well-nourished. No distress.  HENT:  Head: Normocephalic and atraumatic.  Right Ear: External ear and ear canal normal. No drainage, swelling or tenderness. No mastoid tenderness. Tympanic membrane is not injected, not scarred, not perforated, not erythematous, not retracted and not bulging.  A middle ear effusion is present. No decreased hearing is noted.  Left Ear: Hearing, external ear and ear canal normal. No drainage, swelling or tenderness. No mastoid tenderness. Tympanic membrane is not injected, not scarred, not perforated, not erythematous, not retracted and not bulging.  No middle ear effusion. No decreased hearing is noted.  Nose: Mucosal edema and rhinorrhea present. Right sinus exhibits no maxillary sinus tenderness and no frontal sinus tenderness. Left sinus exhibits no maxillary sinus tenderness and no frontal sinus tenderness.  Mouth/Throat: Uvula is midline and mucous membranes are  normal. Mucous membranes are not pale, not dry and not cyanotic. Posterior oropharyngeal erythema present. No oropharyngeal exudate, posterior oropharyngeal edema or tonsillar abscesses.  Erythematous nasal mucosa  Eyes: Conjunctivae and EOM are normal. Pupils are equal, round, and reactive to light. Right eye exhibits no discharge. Left eye exhibits no discharge. No scleral icterus.  Neck: Normal range of motion. Neck supple.  Cardiovascular: Normal rate, regular rhythm and normal heart sounds.  Exam reveals no gallop and no friction rub.   No murmur heard. Pulmonary/Chest: Effort normal and breath sounds normal. No accessory muscle usage. No tachypnea. No respiratory distress. She has no decreased breath sounds. She has no wheezes. She has no rales. She exhibits no tenderness.  Abdominal: Soft. Bowel sounds are normal. She exhibits no distension. There is no tenderness. There is no rebound and no guarding.  Lymphadenopathy:    She has no cervical adenopathy.  Neurological: She is alert and oriented to person, place, and time.  Skin: Skin is warm and dry. No rash noted. She is not diaphoretic. No erythema. No pallor.  Psychiatric: She has a normal mood and affect.  Nursing note and vitals reviewed.   ED Course  Procedures (including critical care time)  DIAGNOSTIC STUDIES: Oxygen Saturation is 100% on room air, normal by my interpretation.    COORDINATION OF CARE: 10:39 AM Discussed treatment plan with pt at bedside and pt agreed to plan.   Labs Review Labs Reviewed - No data to display  Imaging Review No results found.   EKG Interpretation None      MDM   Final diagnoses:  URI (upper respiratory infection)    Pt presents with cough, ST, and rhinorhhea - consistent with URI, no sx, signs of PNA, sinus infection, otitis media, although serous effusion in right ear. Explained viral URI to pt, with supportive treatment.  Explained to f/u with signs/sx of PNA or with sinus  pain/pressure with fever.  Encouraged to establish PCP.  I personally performed the services described in this documentation, which was scribed in my presence. The recorded information has been reviewed, edtied and is accurate.    Danelle Berry, PA-C 06/08/15 1730  Toy Cookey, MD 06/09/15 1504

## 2015-08-14 ENCOUNTER — Encounter (HOSPITAL_COMMUNITY): Payer: Self-pay | Admitting: Vascular Surgery

## 2015-08-14 ENCOUNTER — Emergency Department (HOSPITAL_COMMUNITY)
Admission: EM | Admit: 2015-08-14 | Discharge: 2015-08-14 | Disposition: A | Discharge status: Home / Self Care | Payer: Self-pay | Attending: Emergency Medicine | Admitting: Emergency Medicine

## 2015-08-14 DIAGNOSIS — R112 Nausea with vomiting, unspecified: Secondary | ICD-10-CM | POA: Insufficient documentation

## 2015-08-14 DIAGNOSIS — Z3202 Encounter for pregnancy test, result negative: Secondary | ICD-10-CM | POA: Insufficient documentation

## 2015-08-14 DIAGNOSIS — R05 Cough: Secondary | ICD-10-CM | POA: Insufficient documentation

## 2015-08-14 DIAGNOSIS — R059 Cough, unspecified: Secondary | ICD-10-CM

## 2015-08-14 DIAGNOSIS — R197 Diarrhea, unspecified: Secondary | ICD-10-CM | POA: Insufficient documentation

## 2015-08-14 DIAGNOSIS — Z79899 Other long term (current) drug therapy: Secondary | ICD-10-CM | POA: Insufficient documentation

## 2015-08-14 DIAGNOSIS — Z8619 Personal history of other infectious and parasitic diseases: Secondary | ICD-10-CM | POA: Insufficient documentation

## 2015-08-14 DIAGNOSIS — R0981 Nasal congestion: Secondary | ICD-10-CM | POA: Insufficient documentation

## 2015-08-14 LAB — URINALYSIS, ROUTINE W REFLEX MICROSCOPIC
Bilirubin Urine: NEGATIVE
GLUCOSE, UA: NEGATIVE mg/dL
Hgb urine dipstick: NEGATIVE
KETONES UR: NEGATIVE mg/dL
LEUKOCYTES UA: NEGATIVE
Nitrite: NEGATIVE
Protein, ur: NEGATIVE mg/dL
SPECIFIC GRAVITY, URINE: 1.031 — AB (ref 1.005–1.030)
Urobilinogen, UA: 0.2 mg/dL (ref 0.0–1.0)
pH: 6 (ref 5.0–8.0)

## 2015-08-14 LAB — CBC
HEMATOCRIT: 38 % (ref 36.0–46.0)
Hemoglobin: 13 g/dL (ref 12.0–15.0)
MCH: 29.3 pg (ref 26.0–34.0)
MCHC: 34.2 g/dL (ref 30.0–36.0)
MCV: 85.8 fL (ref 78.0–100.0)
PLATELETS: 264 10*3/uL (ref 150–400)
RBC: 4.43 MIL/uL (ref 3.87–5.11)
RDW: 13.1 % (ref 11.5–15.5)
WBC: 8.6 10*3/uL (ref 4.0–10.5)

## 2015-08-14 LAB — COMPREHENSIVE METABOLIC PANEL
ALT: 13 U/L — AB (ref 14–54)
AST: 18 U/L (ref 15–41)
Albumin: 3.4 g/dL — ABNORMAL LOW (ref 3.5–5.0)
Alkaline Phosphatase: 49 U/L (ref 38–126)
Anion gap: 7 (ref 5–15)
BUN: 10 mg/dL (ref 6–20)
CHLORIDE: 109 mmol/L (ref 101–111)
CO2: 23 mmol/L (ref 22–32)
CREATININE: 0.71 mg/dL (ref 0.44–1.00)
Calcium: 8.6 mg/dL — ABNORMAL LOW (ref 8.9–10.3)
GFR calc Af Amer: >60 mL/min (ref >60)
GFR calc non Af Amer: >60 mL/min (ref >60)
Glucose, Bld: 94 mg/dL (ref 65–99)
POTASSIUM: 3.6 mmol/L (ref 3.5–5.1)
SODIUM: 139 mmol/L (ref 135–145)
Total Bilirubin: 0.4 mg/dL (ref 0.3–1.2)
Total Protein: 6.2 g/dL — ABNORMAL LOW (ref 6.5–8.1)

## 2015-08-14 LAB — POC URINE PREG, ED: Preg Test, Ur: NEGATIVE

## 2015-08-14 LAB — LIPASE, BLOOD: LIPASE: 24 U/L (ref 22–51)

## 2015-08-14 MED ORDER — BENZONATATE 100 MG PO CAPS: 100.0000 mg | ORAL_CAPSULE | Freq: Three times a day (TID) | ORAL | Status: DC

## 2015-08-14 MED ORDER — ONDANSETRON 4 MG PO TBDP
4.0000 mg | ORAL_TABLET | Freq: Once | ORAL | Status: AC | PRN
  Administered 2015-08-14: 4 mg via ORAL

## 2015-08-14 MED ORDER — ONDANSETRON HCL 4 MG PO TABS: 4.0000 mg | ORAL_TABLET | Freq: Three times a day (TID) | ORAL | Status: DC | PRN

## 2015-08-14 MED ORDER — ONDANSETRON 4 MG PO TBDP
ORAL_TABLET | ORAL | Status: AC
  Filled 2015-08-14: qty 1

## 2015-08-14 NOTE — ED Notes (Signed)
Declined W/C at D/C and was escorted to lobby by RN. 

## 2015-08-14 NOTE — Discharge Instructions (Signed)
1. Medications: tessalon, zofran, usual home medications 2. Treatment: rest, drink plenty of fluids 3. Follow Up: please followup with your primary doctor this week for discussion of your diagnoses and further evaluation after today's visit; if you do not have a primary care doctor use the resource guide provided to find one; please return to the ER for fever, chills, chest pain, shortness of breath, severe abdominal pain, persistent vomiting    Cough, Adult  A cough is a reflex. It helps you clear your throat and airways. A cough can help heal your body. A cough can last 2 or 3 weeks (acute) or may last more than 8 weeks (chronic). Some common causes of a cough can include an infection, allergy, or a cold. HOME CARE  Only take medicine as told by your doctor.  If given, take your medicines (antibiotics) as told. Finish them even if you start to feel better.  Use a cold steam vaporizer or humidifier in your home. This can help loosen thick spit (secretions).  Sleep so you are almost sitting up (semi-upright). Use pillows to do this. This helps reduce coughing.  Rest as needed.  Stop smoking if you smoke. GET HELP RIGHT AWAY IF:  You have yellowish-white fluid (pus) in your thick spit.  Your cough gets worse.  Your medicine does not reduce coughing, and you are losing sleep.  You cough up blood.  You have trouble breathing.  Your pain gets worse and medicine does not help.  You have a fever. MAKE SURE YOU:   Understand these instructions.  Will watch your condition.  Will get help right away if you are not doing well or get worse. Document Released: 08/15/2011 Document Revised: 04/18/2014 Document Reviewed: 08/15/2011 [facility] Patient Information 2015 [place], [place]. This information is not intended to replace advice given to you by your health care provider. Make sure you discuss any questions you have with your health care provider.  Diarrhea Diarrhea is watery poop  (stool). It can make you feel weak, tired, thirsty, or give you a dry mouth (signs of dehydration). Watery poop is a sign of another problem, most often an infection. It often lasts 2-3 days. It can last longer if it is a sign of something serious. Take care of yourself as told by your doctor. HOME CARE   Drink 1 cup (8 ounces) of fluid each time you have watery poop.  Do not drink the following fluids:  Those that contain simple sugars (fructose, glucose, galactose, lactose, sucrose, maltose).  Sports drinks.  Fruit juices.  Whole milk products.  Sodas.  Drinks with caffeine (coffee, tea, soda) or alcohol.  Oral rehydration solution may be used if the doctor says it is okay. You may make your own solution. Follow this recipe:   - teaspoon table salt.   teaspoon baking soda.   teaspoon salt substitute containing potassium chloride.  1 tablespoons sugar.  1 liter (34 ounces) of water.  Avoid the following foods:  High fiber foods, such as raw fruits and vegetables.  Nuts, seeds, and whole grain breads and cereals.   Those that are sweetened with sugar alcohols (xylitol, sorbitol, mannitol).  Try eating the following foods:  Starchy foods, such as rice, toast, pasta, low-sugar cereal, oatmeal, baked potatoes, crackers, and bagels.  Bananas.  Applesauce.  Eat probiotic-rich foods, such as yogurt and milk products that are fermented.  Wash your hands well after each time you have watery poop.  Only take medicine as told by your  doctor.  Take a warm bath to help lessen burning or pain from having watery poop. GET HELP RIGHT AWAY IF:   You cannot drink fluids without throwing up (vomiting).  You keep throwing up.  You have blood in your poop, or your poop looks black and tarry.  You do not pee (urinate) in 6-8 hours, or there is only a small amount of very dark pee.  You have belly (abdominal) pain that gets worse or stays in the same spot  (localizes).  You are weak, dizzy, confused, or light-headed.  You have a very bad headache.  Your watery poop gets worse or does not get better.  You have a fever or lasting symptoms for more than 2-3 days.  You have a fever and your symptoms suddenly get worse. MAKE SURE YOU:   Understand these instructions.  Will watch your condition.  Will get help right away if you are not doing well or get worse. Document Released: 05/20/2008 Document Revised: 04/18/2014 Document Reviewed: 08/09/2012 [facility] Patient Information 2015 [place], [place]. This information is not intended to replace advice given to you by your health care provider. Make sure you discuss any questions you have with your health care provider.  Nausea and Vomiting Nausea is a sick feeling that often comes before throwing up (vomiting). Vomiting is a reflex where stomach contents come out of your mouth. Vomiting can cause severe loss of body fluids (dehydration). Children and elderly adults can become dehydrated quickly, especially if they also have diarrhea. Nausea and vomiting are symptoms of a condition or disease. It is important to find the cause of your symptoms. CAUSES   Direct irritation of the stomach lining. This irritation can result from increased acid production (gastroesophageal reflux disease), infection, food poisoning, taking certain medicines (such as nonsteroidal anti-inflammatory drugs), alcohol use, or tobacco use.  Signals from the brain.These signals could be caused by a headache, heat exposure, an inner ear disturbance, increased pressure in the brain from injury, infection, a tumor, or a concussion, pain, emotional stimulus, or metabolic problems.  An obstruction in the gastrointestinal tract (bowel obstruction).  Illnesses such as diabetes, hepatitis, gallbladder problems, appendicitis, kidney problems, cancer, sepsis, atypical symptoms of a heart attack, or eating disorders.  Medical  treatments such as chemotherapy and radiation.  Receiving medicine that makes you sleep (general anesthetic) during surgery. DIAGNOSIS Your caregiver may ask for tests to be done if the problems do not improve after a few days. Tests may also be done if symptoms are severe or if the reason for the nausea and vomiting is not clear. Tests may include:  Urine tests.  Blood tests.  Stool tests.  Cultures (to look for evidence of infection).  X-rays or other imaging studies. Test results can help your caregiver make decisions about treatment or the need for additional tests. TREATMENT You need to stay well hydrated. Drink frequently but in small amounts.You may wish to drink water, sports drinks, clear broth, or eat frozen ice pops or gelatin dessert to help stay hydrated.When you eat, eating slowly may help prevent nausea.There are also some antinausea medicines that may help prevent nausea. HOME CARE INSTRUCTIONS   Take all medicine as directed by your caregiver.  If you do not have an appetite, do not force yourself to eat. However, you must continue to drink fluids.  If you have an appetite, eat a normal diet unless your caregiver tells you differently.  Eat a variety of complex carbohydrates (rice, wheat, potatoes, bread),  lean meats, yogurt, fruits, and vegetables.  Avoid high-fat foods because they are more difficult to digest.  Drink enough water and fluids to keep your urine clear or pale yellow.  If you are dehydrated, ask your caregiver for specific rehydration instructions. Signs of dehydration may include:  Severe thirst.  Dry lips and mouth.  Dizziness.  Dark urine.  Decreasing urine frequency and amount.  Confusion.  Rapid breathing or pulse. SEEK IMMEDIATE MEDICAL CARE IF:   You have blood or brown flecks (like coffee grounds) in your vomit.  You have black or bloody stools.  You have a severe headache or stiff neck.  You are confused.  You have  severe abdominal pain.  You have chest pain or trouble breathing.  You do not urinate at least once every 8 hours.  You develop cold or clammy skin.  You continue to vomit for longer than 24 to 48 hours.  You have a fever. MAKE SURE YOU:   Understand these instructions.  Will watch your condition.  Will get help right away if you are not doing well or get worse. Document Released: 12/02/2005 Document Revised: 02/24/2012 Document Reviewed: 05/01/2011 [facility] Patient Information 2015 [place], [place]. This information is not intended to replace advice given to you by your health care provider. Make sure you discuss any questions you have with your health care provider.   Emergency Department Resource Guide 1) Find a Doctor and Pay Out of Pocket Although you won't have to find out who is covered by your insurance plan, it is a good idea to ask around and get recommendations. You will then need to call the office and see if the doctor you have chosen will accept you as a new patient and what types of options they offer for patients who are self-pay. Some doctors offer discounts or will set up payment plans for their patients who do not have insurance, but you will need to ask so you aren't surprised when you get to your appointment.  2) Contact Your Local Health Department Not all health departments have doctors that can see patients for sick visits, but many do, so it is worth a call to see if yours does. If you don't know where your local health department is, you can check in your phone book. The CDC also has a tool to help you locate your state's health department, and many state websites also have listings of all of their local health departments.  3) Find a Walk-in Clinic If your illness is not likely to be very severe or complicated, you may want to try a walk in clinic. These are popping up all over the country in pharmacies, drugstores, and shopping centers. They're usually staffed  by nurse practitioners or physician assistants that have been trained to treat common illnesses and complaints. They're usually fairly quick and inexpensive. However, if you have serious medical issues or chronic medical problems, these are probably not your best option.  No Primary Care Doctor: - Call Health Connect at  [phone] - they can help you locate a primary care doctor that  accepts your insurance, provides certain services, etc. - Physician Referral Service- [phone]  Chronic Pain Problems: Organization         Address  Phone   Notes  [facility]  [phone] Patients need to be referred by their primary care doctor.   Medication Assistance: Retail buyer  Notes  [facility] [address] [place] [place], [place] [address] [phone] --Must be a resident of [facility] -- Must have NO insurance coverage whatsoever (no Medicaid/ Medicare, etc.) -- The pt. MUST have a primary care doctor that directs their care regularly and follows them in the community   MedAssist  [phone]   [employer]  [phone]    Agencies that provide inexpensive medical care: Organization         Address  Phone   Notes  [facility]  [phone]   [facility]    [phone]   [facility] [address] [place], [place] [address] [phone]   [facility] [address] [place] [phone]   Planned Parenthood    [phone]   [facility]    [phone]   [facility]  [address], [place] Phone:  [phone], Fax:  [phone] Hours of Operation:  9 am - 6 pm, M-F.  Also accepts Medicaid/Medicare and self-pay.  [facility]  [address], [place] Phone: [phone], Fax: [phone]. Hours of Operation:   8:30 am - 5:30 pm, M-F.  Also accepts Medicaid and self-pay.  [facility] [place] [address] [place] Phone: [phone]   [facility] [address] Natasha Bence [place], [place] [phone], Ext. [phone] Mondays & Thursdays: 7-9 AM.  First 15 patients are seen on a first come, first serve basis.    Medicaid-accepting [facility] Providers:  Organization         Address  Phone   Notes  [facility] [address], [place] [phone] Also accepts self-pay patients.  [facility] [address] Laurell Josephs [place], [place]  [phone]   [facility] [address], [place] [phone]   [facility] [address] [place] [phone]   Renaye Rakers [address], [place]   [phone] Only accepts [place] Access [place] patients after they have their name applied to their card.   Self-Pay (no insurance) in [facility]:  Organization         Address  Phone   Notes  Sickle Cell Patients, [facility] [address] [place], [place] [phone]   [facility] [address] [place], [place] [phone]   [facility] [place]  [address], [place] [phone]   [facility]/Dr. Osei-Bonsu  [address] [place] or [address], [place] [phone] Phone number for both [place] and [place] locations is the same.  [facility] [address], [place] [phone]   [facility] [address], [place] or [address] [phone]   [facility] [address], [place] [phone], phone; [phone], fax Sees patients 1st and 3rd Saturday of every month.  Must not qualify for public or private insurance (i.e. Medicaid, Medicare, [place] Health  Choice, Veterans' Benefits)  Household income should be no more than 200% of the poverty level The clinic cannot treat you if you are pregnant or think you are pregnant  Sexually transmitted  diseases are not treated at the clinic.    Dental Care: Organization         Address  Phone  Notes  [facility] Department [facility] [place] [facility] [address] [place], [place] [phone] Accepts children up to age 65 who are enrolled in [place] or [place] Health Choice; pregnant women with a Medicaid card; and children who have applied for Medicaid or [place] Health Choice, but were declined, whose parents can pay a reduced fee at time of service.  [facility] Department of [facility]  [address], [place] [phone] Accepts children up to age 41 who are enrolled in [place] or [place] Health Choice; pregnant women with a Medicaid card; and children who have applied for Medicaid or [place] Health Choice, but were declined, whose parents can pay a reduced fee at time of service.  [facility]  [address] [place], [place] [phone] Patients are seen by appointment only. Walk-ins are not accepted. [facility] will see patients 57 years of age and older. Monday - Tuesday (8am-5pm) Most Wednesdays (8:30-5pm) $30 per visit, cash only  [facility] Adult [facility] Access [facility]  [address], [facility] [phone] Patients are seen by appointment only. Walk-ins are not accepted. [facility] will see patients 55 years of age and older. One Wednesday Evening (Monthly: Volunteer Based).  $30 per visit, cash only  [employer]  [phone] for adults; Children under age 41, call Graduate Pediatric Dentistry at [phone]. Children aged 80-14, please call [phone] to request a pediatric application.  Dental services are provided in all areas of dental care including fillings, crowns and bridges, complete  and partial dentures, implants, gum treatment, root canals, and extractions. Preventive care is also provided. Treatment is provided to both adults and children. Patients are selected via a lottery and there is often a waiting list.   [facility] [address], [place]  [phone] [email or web address]   [facility] [address] [place], [place] [phone], Ext. [phone] Second and Fourth Thursday of each month, opens at 6:30 AM; Clinic ends at 9 AM.  Patients are seen on a first-come first-served basis, and a limited number are seen during each clinic.   [facility]  [address] Ether Griffins [place], [place] [phone]   Eligibility Requirements You must have lived in [place], [place], or [place] counties for at least the last three months.   You cannot be eligible for state or federal sponsored [place], including [employer], [place], or [employer].   You generally cannot be eligible for healthcare insurance through your employer.    How to apply: Eligibility screenings are held every Tuesday and Wednesday afternoon from 1:00 pm until 4:00 pm. You do not need an appointment for the interview!  [facility] [address], [place], [place] [phone]   [facility] Health Department  [phone]   [facility] Health Department  [phone]   [facility] Health Department  [phone]    Behavioral Health Resources in the Community: Intensive Outpatient Programs Organization         Address  Phone  Notes  [facility] Services [address] [place], [place] [phone]   [facility] Outpatient [address], [place], [place] [phone]   [facility] [address] [place], [place]  [phone]  [facility] [address],  [place], [place] [phone] or [phone]   Substance Abuse Resources Organization          Address  Phone  Notes  [facility]  [phone]   [facility]  [phone]   [facility] [place]  [phone]   Floydene Flock  [phone]   Residential & Outpatient Substance Abuse Program  [phone]   Psychological Services Organization         Address  Phone  Notes  [facility]  [phone]   [facility]  [phone]   [facility] [address] [place] [phone] or [phone]    Mobile Crisis Teams Organization         Address  Phone  Notes  [facility], Mobile Crisis Care Unit  [phone]   [facility]  [address] [place], [place] [phone]   Doristine Locks [address] [place] [phone]    Self-Help/Support Groups Organization         Address  Phone             Notes  [facility] [place] - variety of support groups  336- I7437963 Call for more information  Narcotics Anonymous (NA), [facility] [address], [employer] [place]  2 meetings at this location   Statistician         Address  Phone  Notes  [facility] [address],    [place]  [phone]   [facility]  [address], [place] [address], [place], [place] [phone]   [facility] [address] [place], [place] [phone] Admissions: 8am-3pm M-F  [facility] [address]    [place], [place] [phone]   [facility] [address] [place], [place], [place] [phone]   [facility] [address],  [place], [place] [phone]   [facility] - Intensive Outpatient [address] Laurell Josephs [address], [place], [place] [phone]   [facility] ([facility] [address] [place]  [place], [place] [phone] or [phone]   [facility] ([facility]) [address], [place], [place]  [phone] Accepts Medicaid  [facility] [address],  [place] [phone] Substance Abuse/Addiction Treatment   [facility] Organization         Address  Phone  Notes  [facility]  [phone]   Angie Fava, PhD [address] Ervin Knack [place], [place]   [phone] or [phone]   [facility] Behavioral   [address] [place], [place] [phone]   [facility] [address], [place], [place] [phone] Insurance/Medicaid/sponsorship through [facility] [address]                                    [place], [place] [phone] Therapy/tele-psych/case  [facility] [address], [place] [phone]    Dr. Lolly Mustache  [phone]   [facility]  [employer] [facility] Dept. 1) [address] [place] 2) [address] [place] 3)  [address], [place] [phone]  [phone]   [facility] Child Abuse Hotline [phone] or [phone] (After Hours)

## 2015-08-14 NOTE — ED Provider Notes (Signed)
CSN: [id number]     Arrival date & time 08/14/15  1243 History   First MD Initiated Contact with Patient 08/14/15 1525     Chief Complaint  Patient presents with  . URI  . Emesis  . Diarrhea     HPI   Tara May is a 39 y.o. female with a PMH of hep C who presents to the ED with congestion and cough since Friday and nausea, vomiting, and diarrhea, which began today. She states her congestion and cough started on Friday, and that her cough has been productive of yellow mucous. She has not tried anything for symptom relief. She denies fever, chills, chest pain, shortness of breath. She also reports nausea and 2 episodes of non-bloody emesis today. She reports diarrhea, and states she has not noticed any blood in her stool. She denies eating anything unusual, antibiotic use, or recent travel. She denies abdominal pain, dysuria, urgency, frequency, flank pain. 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 She also states she was exposed to young children at an open house at school on Thursday.   Past Medical History  Diagnosis Date  . Hep C w/o coma, chronic    Past Surgical History  Procedure Laterality Date  . Tubal ligation     No family history on file. Social History  Substance Use Topics  . Smoking status: Never Smoker   . Smokeless tobacco: None  . Alcohol Use: No   OB History    No data available      Review of Systems  Constitutional: Negative for fever, chills, diaphoresis, activity change, appetite change and fatigue.  HENT: Positive for congestion. Negative for ear discharge, ear pain and sore throat.   Eyes: Negative for visual disturbance.  Respiratory: Positive for cough. Negative for shortness of breath.   Cardiovascular: Negative for chest pain, palpitations and leg swelling.  Gastrointestinal: Positive for nausea, vomiting and diarrhea. Negative for abdominal pain, constipation and abdominal distention.   Genitourinary: Negative for dysuria, urgency and frequency.  Musculoskeletal: Negative for myalgias, back pain, arthralgias, neck pain and neck stiffness.  Skin: Negative for color change, pallor, rash and wound.  Neurological: Negative for dizziness, syncope, weakness, light-headedness, numbness and headaches.  All other systems reviewed and are negative.     Allergies  Review of patient's allergies indicates no known allergies.  Home Medications   Prior to Admission medications   Medication Sig Start Date End Date Taking? Authorizing Provider  benzonatate (TESSALON) 100 MG capsule Take 1 capsule (100 mg total) by mouth every 8 (eight) hours. 08/14/15   Mady Gemma, PA-C  doxycycline (VIBRAMYCIN) 100 MG capsule Take 1 capsule (100 mg total) by mouth 2 (two) times daily. 08/02/14   Glynn Octave, MD  hydrocortisone cream 1 % Apply to affected area 2 times daily 08/02/14   Glynn Octave, MD  ibuprofen (ADVIL,MOTRIN) 200 MG tablet Take 400 mg by mouth every 6 (six) hours as needed for moderate pain.    Historical Provider, MD  loratadine (CLARITIN) 10 MG tablet Take 1 tablet (10 mg total) by mouth daily. 06/06/15   Danelle Berry, PA-C  ondansetron (ZOFRAN) 4 MG tablet Take 1 tablet (4 mg total) by mouth every 8 (eight) hours as needed for nausea or vomiting. 08/14/15   Mady Gemma, PA-C  OVER THE COUNTER MEDICATION Take 1 tablet by mouth daily as needed (for headaches).    Historical Provider, MD  promethazine (PHENERGAN) 25 MG tablet Take 1 tablet (25 mg total) by mouth every 6 (six) hours as needed for nausea or vomiting. 07/26/14   Tatyana Kirichenko, PA-C    BP 123/86 mmHg  Pulse 61  Temp(Src) 98 F (36.7 C) (Oral)  Resp 16  SpO2 100%  LMP 08/07/2015 Physical Exam  Constitutional: She is oriented to person, place, and time. She appears well-developed and well-nourished. No distress.  HENT:  Head: Normocephalic and atraumatic.  Right Ear: External ear normal.   Left Ear: External ear normal.  Nose: Nose normal.  Mouth/Throat: Uvula is midline, oropharynx is clear and moist and mucous membranes are normal. No oropharyngeal exudate.  Eyes: Conjunctivae, EOM and lids are normal. Pupils are equal, round, and reactive to light. Right eye exhibits no discharge. Left eye exhibits no discharge. No scleral icterus.  Neck: Normal range of motion. Neck supple.  Cardiovascular: Normal rate, regular rhythm, normal heart sounds, intact distal pulses and normal pulses.   Pulmonary/Chest: Effort normal and breath sounds normal. No respiratory distress. She has no wheezes. She has no rales. She exhibits no tenderness.  Abdominal: Soft. Normal appearance and bowel sounds are normal. She exhibits no distension and no mass. There is no tenderness. There is no rigidity, no rebound and no guarding.  Musculoskeletal: Normal range of motion. She exhibits no edema or tenderness.  Lymphadenopathy:    She has no cervical adenopathy.  Neurological: She is alert and oriented to person, place, and time. She has normal strength.  Skin: Skin is warm, dry and intact. No rash noted. She is not diaphoretic. No erythema. No pallor.  Psychiatric: She has a normal mood and affect. Her speech is normal and behavior is normal. Judgment and thought content normal.  Nursing note and vitals reviewed.     ED Course  Procedures (including critical care time)  Labs Review Labs Reviewed  COMPREHENSIVE METABOLIC PANEL - Abnormal; Notable for the following:    Calcium 8.6 (*)    Total Protein 6.2 (*)    Albumin 3.4 (*)    ALT 13 (*)    All other components within normal limits  URINALYSIS, ROUTINE W REFLEX MICROSCOPIC (NOT AT [facility]) - Abnormal; Notable for the following:    APPearance CLOUDY (*)    Specific Gravity, Urine 1.031 (*)    All other components within normal limits  LIPASE, BLOOD  CBC  POC URINE PREG, ED    Imaging Review No results found.   I have personally reviewed  and evaluated these lab results as part of my medical decision-making.   EKG Interpretation None      MDM   Final diagnoses:  Cough  Nasal congestion  Nausea vomiting and diarrhea   39 year old female presents with cough and nasal congestion since Friday and nausea, vomiting, and diarrhea, which began today. Denies fever, chills, chest pain, shortness of breath. Denies eating anything unusual, antibiotic use, or recent travel. Denies abdominal pain, dysuria, urgency, frequency, flank pain. 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  Patient is afebrile. Vital signs stable. O2 sat 100% on RA. On exam, mucous membranes moist. Lungs clear to auscultation bilaterally. Abdomen non-tender to palpation with no rebound, guarding, or masses.  CBC, CMP, lipase unremarkable. UA negative for infection. Nausea treated with zofran in the ED. Patient able to tolerate PO intake.  Symptoms most likely consistent with viral infection. Will discharge with tessalon for cough  and zofran for nausea control. Patient to follow up with PCP. Return precautions discussed.  BP 123/86 mmHg  Pulse 61  Temp(Src) 98 F (36.7 C) (Oral)  Resp 16  SpO2 100%  LMP 08/07/2015      Mady Gemma, PA-C 08/14/15 2311  Marily Memos, MD 08/17/15 1520

## 2015-08-14 NOTE — ED Notes (Signed)
Pt reports to the ED for eval of URI and N/V/D. She reports on Friday she had developed some nasal and drainage and a productive yellow cough. She reports that today she developed N/V/D. Denies any blood in her stool. Pt denies any fevers. Pt A&OX4, resp e/u, and skin warm and dry. Was exposed to many children at an open house for school on Thursday.

## 2015-10-29 ENCOUNTER — Emergency Department (HOSPITAL_COMMUNITY)
Admission: EM | Admit: 2015-10-29 | Discharge: 2015-10-29 | Disposition: A | Discharge status: Home / Self Care | Payer: Self-pay | Attending: Physician Assistant | Admitting: Physician Assistant

## 2015-10-29 ENCOUNTER — Encounter (HOSPITAL_COMMUNITY): Payer: Self-pay | Admitting: Emergency Medicine

## 2015-10-29 DIAGNOSIS — J029 Acute pharyngitis, unspecified: Secondary | ICD-10-CM

## 2015-10-29 DIAGNOSIS — J3489 Other specified disorders of nose and nasal sinuses: Secondary | ICD-10-CM

## 2015-10-29 DIAGNOSIS — Z79899 Other long term (current) drug therapy: Secondary | ICD-10-CM | POA: Insufficient documentation

## 2015-10-29 DIAGNOSIS — Z7952 Long term (current) use of systemic steroids: Secondary | ICD-10-CM | POA: Insufficient documentation

## 2015-10-29 DIAGNOSIS — R059 Cough, unspecified: Secondary | ICD-10-CM

## 2015-10-29 DIAGNOSIS — R05 Cough: Secondary | ICD-10-CM

## 2015-10-29 DIAGNOSIS — H9209 Otalgia, unspecified ear: Secondary | ICD-10-CM | POA: Insufficient documentation

## 2015-10-29 DIAGNOSIS — Z8619 Personal history of other infectious and parasitic diseases: Secondary | ICD-10-CM | POA: Insufficient documentation

## 2015-10-29 DIAGNOSIS — B349 Viral infection, unspecified: Secondary | ICD-10-CM | POA: Insufficient documentation

## 2015-10-29 DIAGNOSIS — J069 Acute upper respiratory infection, unspecified: Secondary | ICD-10-CM | POA: Insufficient documentation

## 2015-10-29 NOTE — ED Provider Notes (Signed)
CSN: [id number]     Arrival date & time 10/29/15  0847 History   First MD Initiated Contact with Patient 10/29/15 [phone]     Chief Complaint  Patient presents with  . URI  . Sore Throat     (Consider location/radiation/quality/duration/timing/severity/associated sxs/prior Treatment) HPI Comments: Tara May is a 39 y.o. female with a PMHx of chronic HepC, who presents to the ED with complaints of URI symptoms including cough with clear and sometimes greenish sputum production worse at night, sore throat, and green rhinorrhea. She scrubs her sore throat is 5/10 intermittent soreness radiating to the right ear worse with swallowing and unrelieved with cough drops, DayQuil, and NyQuil. 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  Denies fevers, chills, drooling, trismus, ear drainage, eye pain/itching/drainage, CP, SOB, wheezing, abd pain, N/V/D/C, hematuria, dysuria, myalgias, arthralgias, numbness, tingling, weakness, or rashes. She does not smoke.  Patient is a 39 y.o. female presenting with URI. The history is provided by the patient. No language interpreter was used.  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 Hep C w/o coma, chronic    Past Surgical History  Procedure Laterality Date  . Tubal ligation     No family history on file. Social History  Substance Use Topics  . Smoking  status: Never Smoker   . Smokeless tobacco: Not on file  . Alcohol Use: No   OB History    No data available     Review of Systems  Constitutional: Negative for fever and chills.  HENT: Positive for congestion, ear pain, rhinorrhea and sore throat. Negative for drooling, ear discharge and trouble swallowing.   Eyes: Negative for pain, discharge and itching.  Respiratory: Positive for cough. Negative for shortness of breath and wheezing.   Cardiovascular: Negative for chest pain.  Gastrointestinal: Negative for nausea, vomiting, abdominal pain, diarrhea and constipation.  Genitourinary: Negative for dysuria and hematuria.  Musculoskeletal: Negative for myalgias and arthralgias.  Skin: Negative for color change.  Allergic/Immunologic: Negative for immunocompromised state.  Neurological: Negative for weakness and numbness.  Psychiatric/Behavioral: Negative for confusion.   10 Systems reviewed and are negative for acute change except as noted in the HPI.     Allergies  Review of patient's allergies indicates no known allergies.  Home Medications   Prior to Admission medications   Medication Sig Start Date End Date Taking? Authorizing Provider  benzonatate (TESSALON) 100 MG capsule Take 1 capsule (100 mg total) by mouth every 8 (eight) hours. 08/14/15   Mady Gemma, PA-C  doxycycline (VIBRAMYCIN) 100 MG capsule Take 1 capsule (100 mg total) by mouth 2 (two) times daily. 08/02/14   Glynn Octave, MD  hydrocortisone cream 1 % Apply to affected area 2 times daily 08/02/14   Glynn Octave, MD  ibuprofen (ADVIL,MOTRIN) 200 MG tablet Take 400 mg by mouth every 6 (six) hours as needed for moderate pain.  Historical Provider, MD  loratadine (CLARITIN) 10 MG tablet Take 1 tablet (10 mg total) by mouth daily. 06/06/15   Danelle BerryLeisa Tapia, PA-C  ondansetron (ZOFRAN) 4 MG tablet Take 1 tablet (4 mg total) by mouth every 8 (eight) hours as needed for nausea or vomiting. 08/14/15   Mady GemmaElizabeth C  Westfall, PA-C  OVER THE COUNTER MEDICATION Take 1 tablet by mouth daily as needed (for headaches).    Historical Provider, MD  promethazine (PHENERGAN) 25 MG tablet Take 1 tablet (25 mg total) by mouth every 6 (six) hours as needed for nausea or vomiting. 07/26/14   Tatyana Kirichenko, PA-C   BP 131/83 mmHg  Pulse 76  Temp(Src) 97.6 F (36.4 C) (Oral)  Resp 18  Ht 5' (1.524 m)  Wt 170 lb (77.111 kg)  BMI 33.20 kg/m2  SpO2 96% Physical Exam  Constitutional: She is oriented to person, place, and time. Vital signs are normal. She appears well-developed and well-nourished.  Non-toxic appearance. No distress.  Afebrile, nontoxic, NAD  HENT:  Head: Normocephalic and atraumatic.  Right Ear: Hearing, tympanic membrane, external ear and ear canal normal.  Left Ear: Hearing, tympanic membrane, external ear and ear canal normal.  Nose: Mucosal edema and rhinorrhea present.  Mouth/Throat: Uvula is midline and mucous membranes are normal. No trismus in the jaw. No uvula swelling. Posterior oropharyngeal erythema present. No oropharyngeal exudate, posterior oropharyngeal edema or tonsillar abscesses.  Ears are clear bilaterally. Nose with mucosal edema and rhinorrhea. Oropharynx erythematous without uvular swelling or deviation, no trismus or drooling, no tonsillar swelling or PTA, no exudates.    Eyes: Conjunctivae and EOM are normal. Right eye exhibits no discharge. Left eye exhibits no discharge.  Neck: Normal range of motion. Neck supple.  Cardiovascular: Normal rate, regular rhythm, normal heart sounds and intact distal pulses.  Exam reveals no gallop and no friction rub.   No murmur heard. Pulmonary/Chest: Effort normal and breath sounds normal. No respiratory distress. She has no decreased breath sounds. She has no wheezes. She has no rhonchi. She has no rales.  CTAB in all lung fields, no w/r/r, no hypoxia or increased WOB, speaking in full sentences, SpO2 96% on RA   Abdominal: Soft. Normal  appearance and bowel sounds are normal. She exhibits no distension. There is no tenderness. There is no rigidity, no rebound, no guarding and no CVA tenderness.  Musculoskeletal: Normal range of motion.  Lymphadenopathy:       Head (right side): Tonsillar adenopathy present. No submandibular adenopathy present.       Head (left side): Tonsillar adenopathy present. No submandibular adenopathy present.    She has cervical adenopathy.  Shotty cervical and tonsillar LAD bilaterally, mildly TTP  Neurological: She is alert and oriented to person, place, and time. She has normal strength. No sensory deficit.  Skin: Skin is warm, dry and intact. No rash noted.  Psychiatric: She has a normal mood and affect.  Nursing note and vitals reviewed.   ED Course  Procedures (including critical care time) Labs Review Labs Reviewed - No data to display  Imaging Review No results found. I have personally reviewed and evaluated these images and lab results as part of my medical decision-making.   EKG Interpretation None      MDM   Final diagnoses:  URI (upper respiratory infection)  Cough  Rhinorrhea  Viral syndrome  Pharyngitis    39 y.o. female here with sore throat and URI symptoms. Throat with mild erythema without tonsillar swelling or exudates,  doubt strep. Pt is afebrile with a clear lung exam. Mild rhinorrhea. Likely viral URI. Pt is agreeable to symptomatic treatment with close follow up with [facility] as needed but spoke at length about emergent changing or worsening of symptoms that should prompt return to ER. Pt voices understanding and is agreeable to plan. Stable at time of discharge.   BP 131/83 mmHg  Pulse 76  Temp(Src) 97.6 F (36.4 C) (Oral)  Resp 18  Ht 5' (1.524 m)  Wt 170 lb (77.111 kg)  BMI 33.20 kg/m2  SpO2 96%  No orders of the defined types were placed in this encounter.     Sorah Falkenstein Camprubi-Soms, PA-C 10/29/15 1610  Courteney Randall An, MD 10/29/15 [phone]

## 2015-10-29 NOTE — Discharge Instructions (Signed)
Continue to stay well-hydrated. Gargle warm salt water and spit it out. Use chloraseptic spray as needed for sore throat. Continue to alternate between Tylenol and Ibuprofen for pain or fever. Use Mucinex for cough suppression/expectoration of mucus. Use netipot and flonase to help with nasal congestion. May consider over-the-counter Benadryl or other antihistamine to decrease secretions and for watery itchy eyes. Followup with [facility] in 5-7 days for recheck of ongoing symptoms. Return to emergency department for emergent changing or worsening of symptoms.   Cough, Adult A cough helps to clear your throat and lungs. A cough may last only 2-3 weeks (acute), or it may last longer than 8 weeks (chronic). Many different things can cause a cough. A cough may be a sign of an illness or another medical condition. HOME CARE  Pay attention to any changes in your cough.  Take medicines only as told by your doctor.  If you were prescribed an antibiotic medicine, take it as told by your doctor. Do not stop taking it even if you start to feel better.  Talk with your doctor before you try using a cough medicine.  Drink enough fluid to keep your pee (urine) clear or pale yellow.  If the air is dry, use a cold steam vaporizer or humidifier in your home.  Stay away from things that make you cough at work or at home.  If your cough is worse at night, try using extra pillows to raise your head up higher while you sleep.  Do not smoke, and try not to be around smoke. If you need help quitting, ask your doctor.  Do not have caffeine.  Do not drink alcohol.  Rest as needed. GET HELP IF:  You have new problems (symptoms).  You cough up yellow fluid (pus).  Your cough does not get better after 2-3 weeks, or your cough gets worse.  Medicine does not help your cough and you are not sleeping well.  You have pain that gets worse or pain that is not helped with medicine.  You have a  fever.  You are losing weight and you do not know why.  You have night sweats. GET HELP RIGHT AWAY IF:  You cough up blood.  You have trouble breathing.  Your heartbeat is very fast.   This information is not intended to replace advice given to you by your health care provider. Make sure you discuss any questions you have with your health care provider.   Document Released: 08/15/2011 Document Revised: 08/23/2015 Document Reviewed: 02/08/2015 Elsevier Interactive Patient Education 2016 [employer]  Viral Infections A virus is a type of germ. Viruses can cause:  Minor sore throats.  Aches and pains.  Headaches.  Runny nose.  Rashes.  Watery eyes.  Tiredness.  Coughs.  Loss of appetite.  Feeling sick to your stomach (nausea).  Throwing up (vomiting).  Watery poop (diarrhea). HOME CARE   Only take medicines as told by your doctor.  Drink enough water and fluids to keep your pee (urine) clear or pale yellow. Sports drinks are a good choice.  Get plenty of rest and eat healthy. Soups and broths with crackers or rice are fine. GET HELP RIGHT AWAY IF:   You have a very bad headache.  You have shortness of breath.  You have chest pain or neck pain.  You have an unusual rash.  You cannot stop throwing up.  You have watery poop that does not stop.  You cannot keep  fluids down.  You or your child has a temperature by mouth above 102 F (38.9 C), not controlled by medicine.  Your baby is older than 3 months with a rectal temperature of 102 F (38.9 C) or higher.  Your baby is 27 months old or younger with a rectal temperature of 100.4 F (38 C) or higher. MAKE SURE YOU:   Understand these instructions.  Will watch this condition.  Will get help right away if you are not doing well or get worse.   This information is not intended to replace advice given to you by your health care provider. Make sure you discuss any questions you have with your  health care provider.   Document Released: 11/14/2008 Document Revised: 02/24/2012 Document Reviewed: 05/10/2015 Elsevier Interactive Patient Education 2016 [employer] Inc[employer]  Sore Throat A sore throat is pain, burning, irritation, or scratchiness of the throat. There is often pain or tenderness when swallowing or talking. A sore throat may be accompanied by other symptoms, such as coughing, sneezing, fever, and swollen neck glands. A sore throat is often the first sign of another sickness, such as a cold, flu, strep throat, or mononucleosis (commonly known as mono). Most sore throats go away without medical treatment. CAUSES  The most common causes of a sore throat include:  A viral infection, such as a cold, flu, or mono.  A bacterial infection, such as strep throat, tonsillitis, or whooping cough.  Seasonal allergies.  Dryness in the air.  Irritants, such as smoke or pollution.  Gastroesophageal reflux disease (GERD). HOME CARE INSTRUCTIONS   Only take over-the-counter medicines as directed by your caregiver.  Drink enough fluids to keep your urine clear or pale yellow.  Rest as needed.  Try using throat sprays, lozenges, or sucking on hard candy to ease any pain (if older than 4 years or as directed).  Sip warm liquids, such as broth, herbal tea, or warm water with honey to relieve pain temporarily. You may also eat or drink cold or frozen liquids such as frozen ice pops.  Gargle with salt water (mix 1 tsp salt with 8 oz of water).  Do not smoke and avoid secondhand smoke.  Put a cool-mist humidifier in your bedroom at night to moisten the air. You can also turn on a hot shower and sit in the bathroom with the door closed for 5-10 minutes. SEEK IMMEDIATE MEDICAL CARE IF:  You have difficulty breathing.  You are unable to swallow fluids, soft foods, or your saliva.  You have increased swelling in the throat.  Your sore throat does not get better in 7 days.  You have  nausea and vomiting.  You have a fever or persistent symptoms for more than 2-3 days.  You have a fever and your symptoms suddenly get worse. MAKE SURE YOU:   Understand these instructions.  Will watch your condition.  Will get help right away if you are not doing well or get worse.   This information is not intended to replace advice given to you by your health care provider. Make sure you discuss any questions you have with your health care provider.   Document Released: 01/09/2005 Document Revised: 12/23/2014 Document Reviewed: 08/09/2012 Elsevier Interactive Patient Education [employer]

## 2015-10-29 NOTE — ED Notes (Signed)
Patient states URI symptoms and sore throat.  Patient states nasal congestion and chest congestion with cough.

## 2015-12-24 ENCOUNTER — Encounter (HOSPITAL_COMMUNITY): Payer: Self-pay | Admitting: Emergency Medicine

## 2015-12-24 ENCOUNTER — Emergency Department (HOSPITAL_COMMUNITY)
Admission: EM | Admit: 2015-12-24 | Discharge: 2015-12-24 | Disposition: A | Discharge status: Home / Self Care | Payer: Self-pay | Attending: Emergency Medicine | Admitting: Emergency Medicine

## 2015-12-24 ENCOUNTER — Emergency Department (HOSPITAL_COMMUNITY): Payer: Self-pay

## 2015-12-24 DIAGNOSIS — Z79899 Other long term (current) drug therapy: Secondary | ICD-10-CM | POA: Insufficient documentation

## 2015-12-24 DIAGNOSIS — R091 Pleurisy: Secondary | ICD-10-CM | POA: Insufficient documentation

## 2015-12-24 DIAGNOSIS — Z8619 Personal history of other infectious and parasitic diseases: Secondary | ICD-10-CM | POA: Insufficient documentation

## 2015-12-24 MED ORDER — IBUPROFEN 800 MG PO TABS: 800.0000 mg | ORAL_TABLET | Freq: Three times a day (TID) | ORAL | Status: DC

## 2015-12-24 MED ORDER — KETOROLAC TROMETHAMINE 60 MG/2ML IM SOLN
60.0000 mg | Freq: Once | INTRAMUSCULAR | Status: AC
  Administered 2015-12-24: 60 mg via INTRAMUSCULAR
  Filled 2015-12-24: qty 2

## 2015-12-24 MED ORDER — ORPHENADRINE CITRATE ER 100 MG PO TB12: 100.0000 mg | ORAL_TABLET | Freq: Two times a day (BID) | ORAL | Status: DC

## 2015-12-24 NOTE — ED Notes (Signed)
Patient transported to X-ray 

## 2015-12-24 NOTE — ED Notes (Signed)
Patient complains of pain to her right side that started yesterday evening.  Pain is worse with coughing.  Patient alert and in no apparent distress at this time.

## 2015-12-24 NOTE — ED Provider Notes (Signed)
CSN: [id number]     Arrival date & time 12/24/15  1016 History   First MD Initiated Contact with Patient 12/24/15 1019     Chief Complaint  Patient presents with  . Flank Pain     (Consider location/radiation/quality/duration/timing/severity/associated sxs/prior Treatment) HPI Patient states 3 days ago she started to develop a dry cough. No associative fever. Mild associated nasal discharge and sore throat. Yesterday cough worsened. The patient reports a small amount of sputum started. She reports she had an episode of vomiting and subsequently developed sharp pain in her right anterolateral chest that is made much worse by cough or deep inspiration or movement. She reports in the emesis she saw a small amount of blood. She denies any abdominal pain or epigastric pain. Patient denies she's had any bloody sputum with cough. No lower extremity swelling or calf pain. She tried over-the-counter NyQuil yesterday for symptoms.  Patient reports she has tubal ligation and thus is not on any birth control. Nonsmoker. No personal history of DVT or PE. Family history unknown patient is adopted. No recent surgical history or travel history. 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 Past Medical History  Diagnosis Date  . Hep C w/o coma, chronic (HCC)    Past Surgical History  Procedure Laterality Date  . Tubal ligation     No family history on file. Social History  Substance Use Topics  . Smoking status: Never Smoker   . Smokeless tobacco: None  . Alcohol Use: No   OB History    No data available     Review of Systems 10 Systems reviewed and are negative for acute change except as noted in the HPI.    Allergies  Review of patient's allergies indicates no known allergies.  Home Medications   Prior to Admission medications   Medication Sig Start Date End Date Taking? Authorizing Provider  Pseudoeph-Doxylamine-DM-APAP (NYQUIL D COLD/FLU PO) Take 1 capsule by mouth 2 (two)  times daily.   Yes Historical Provider, MD  benzonatate (TESSALON) 100 MG capsule Take 1 capsule (100 mg total) by mouth every 8 (eight) hours. Patient not taking: Reported on 12/24/2015 08/14/15   Mady GemmaElizabeth C Westfall, PA-C  doxycycline (VIBRAMYCIN) 100 MG capsule Take 1 capsule (100 mg total) by mouth 2 (two) times daily. Patient not taking: Reported on 12/24/2015 08/02/14   Glynn OctaveStephen Rancour, MD  hydrocortisone cream 1 % Apply to affected area 2 times daily Patient not taking: Reported on 12/24/2015 08/02/14   Glynn OctaveStephen Rancour, MD  ibuprofen (ADVIL,MOTRIN) 200 MG tablet Take 400 mg by mouth every 6 (six) hours as needed for moderate pain.    Historical Provider, MD  ibuprofen (ADVIL,MOTRIN) 800 MG tablet Take 1 tablet (800 mg total) by mouth 3 (three) times daily. 12/24/15   Arby BarretteMarcy Kyliana Standen, MD  loratadine (CLARITIN) 10 MG tablet Take 1 tablet (10 mg total) by mouth daily. Patient not taking: Reported on 12/24/2015 06/06/15   Danelle BerryLeisa Tapia, PA-C  ondansetron (ZOFRAN) 4 MG tablet Take 1 tablet (4 mg total) by mouth every 8 (eight) hours as needed for nausea or vomiting. Patient not taking: Reported on 12/24/2015 08/14/15   Mady GemmaElizabeth C Westfall, PA-C  orphenadrine (NORFLEX) 100 MG tablet Take 1 tablet (100 mg total) by mouth 2 (two) times daily. 12/24/15   Arby BarretteMarcy Derius Ghosh, MD  OVER THE COUNTER MEDICATION Take 1 tablet by mouth daily as needed (for headaches).    Historical Provider, MD  promethazine (PHENERGAN) 25 MG tablet Take 1 tablet (25 mg  total) by mouth every 6 (six) hours as needed for nausea or vomiting. Patient not taking: Reported on 12/24/2015 07/26/14   Tatyana Kirichenko, PA-C   BP 141/75 mmHg  Pulse 76  Temp(Src) 97.7 F (36.5 C) (Oral)  Resp 17  SpO2 100%  LMP 12/04/2015 Physical Exam  Constitutional: She is oriented to person, place, and time. She appears well-developed and well-nourished.  HENT:  Head: Normocephalic and atraumatic.  Mouth/Throat: Oropharynx is clear and moist.  Eyes: EOM are  normal. Pupils are equal, round, and reactive to light.  Neck: Neck supple.  Cardiovascular: Normal rate, regular rhythm, normal heart sounds and intact distal pulses.   Pulmonary/Chest: Effort normal and breath sounds normal. She exhibits tenderness.  Patient has pain with deep inspiration that she describes is localizing to her right anterior chest beneath her breast. To palpation patient has discomfort on the right sternocostal margins and in the right anterior ribs inferior to her breast. Palpable abnormality. No rashes.  Abdominal: Soft. Bowel sounds are normal. She exhibits no distension. There is no tenderness.  Musculoskeletal: Normal range of motion. She exhibits no edema or tenderness.  No popliteal fossa tenderness no peripheral edema and no calf tenderness to compression.  Neurological: She is alert and oriented to person, place, and time. She has normal strength. Coordination normal. GCS eye subscore is 4. GCS verbal subscore is 5. GCS motor subscore is 6.  Skin: Skin is warm, dry and intact.  Psychiatric: She has a normal mood and affect.    ED Course  Procedures (including critical care time) Labs Review Labs Reviewed - No data to display  Imaging Review Dg Chest 2 View  12/24/2015  CLINICAL DATA:  Cough and congestion EXAM: CHEST  2 VIEW COMPARISON:  08/01/2014 FINDINGS: The heart size and mediastinal contours are within normal limits. Both lungs are clear. The visualized skeletal structures are unremarkable. IMPRESSION: No active cardiopulmonary disease. Electronically Signed   By: Signa Kell M.D.   On: 12/24/2015 12:15   I have personally reviewed and evaluated these images and lab results as part of my medical decision-making.   EKG Interpretation None      MDM   Final diagnoses:  Pleurisy   The patient has sharp chest pain consistent with pleurisy. She had preceding cough with viral URI symptoms. Patient does not have any pulmonary embolus risk factors. He is  nontoxic and alert with no respiratory distress and normal chest x-ray. She'll be treated with ibuprofen and muscle relaxer for chest wall pain\pleurisy. Signs and symptoms for which to return are provided.   Arby Barrette, MD 12/24/15 [phone]

## 2015-12-24 NOTE — Discharge Instructions (Signed)
°Pleurisy °Pleurisy is an inflammation and swelling of the lining of the lungs (pleura). Because of this inflammation, it hurts to breathe. It can be aggravated by coughing, laughing, or deep breathing. Pleurisy is often caused by an underlying infection or disease.  °HOME CARE INSTRUCTIONS  °Monitor your pleurisy for any changes. The following actions may help to alleviate any discomfort you are experiencing: °· Medicine may help with pain. Only take over-the-counter or prescription medicines for pain, discomfort, or fever as directed by your health care provider. °· Only take antibiotic medicine as directed. Make sure to finish it even if you start to feel better. °SEEK MEDICAL CARE IF:  °· Your pain is not controlled with medicine or is increasing. °· You have an increase in pus-like (purulent) secretions brought up with coughing. °SEEK IMMEDIATE MEDICAL CARE IF:  °· You have blue or dark lips, fingernails, or toenails. °· You are coughing up blood. °· You have increased difficulty breathing. °· You have continuing pain unrelieved by medicine or pain lasting more than 1 week. °· You have pain that radiates into your neck, arms, or jaw. °· You develop increased shortness of breath or wheezing. °· You develop a fever, rash, vomiting, fainting, or other serious symptoms. °MAKE SURE YOU: °· Understand these instructions.   °· Will watch your condition.   °· Will get help right away if you are not doing well or get worse. °  °  °This information is not intended to replace advice given to you by your health care provider. Make sure you discuss any questions you have with your health care provider. °  °Document Released: 12/02/2005 Document Revised: 08/04/2013 Document Reviewed: 05/16/2013 °Elsevier Interactive Patient Education ©2016 Elsevier Inc. ° ° °Emergency Department Resource Guide °1) Find a Doctor and Pay Out of Pocket °Although you won't have to find out who is covered by your insurance plan, it is a good  idea to ask around and get recommendations. You will then need to call the office and see if the doctor you have chosen will accept you as a new patient and what types of options they offer for patients who are self-pay. Some doctors offer discounts or will set up payment plans for their patients who do not have insurance, but you will need to ask so you aren't surprised when you get to your appointment. ° °2) Contact Your Local Health Department °Not all health departments have doctors that can see patients for sick visits, but many do, so it is worth a call to see if yours does. If you don't know where your local health department is, you can check in your phone book. The CDC also has a tool to help you locate your state's health department, and many state websites also have listings of all of their local health departments. ° °3) Find a Walk-in Clinic °If your illness is not likely to be very severe or complicated, you may want to try a walk in clinic. These are popping up all over the country in pharmacies, drugstores, and shopping centers. They're usually staffed by nurse practitioners or physician assistants that have been trained to treat common illnesses and complaints. They're usually fairly quick and inexpensive. However, if you have serious medical issues or chronic medical problems, these are probably not your best option. ° °No Primary Care Doctor: °- Call [facility] at  832-8000 - they can help you locate a primary care doctor that  accepts your insurance, provides certain services, etc. °- Physician Referral Service-   1-800-533-3463 ° °Chronic Pain Problems: °Organization         Address  Phone   Notes  °[facility]  (336) 297-2271 Patients need to be referred by their primary care doctor.  ° °Medication Assistance: °Organization         Address  Phone   Notes  °[place] [facility] [address] °[place], [place] [address] (336) 641-8030  --Must be a resident of [place] °-- Must have NO insurance coverage whatsoever (no Medicaid/ Medicare, etc.) °-- The pt. MUST have a primary care doctor that directs their care regularly and follows them in the community °  °[facility]  (866) 331-1348   °[employer]  (888) 892-1162   ° °Agencies that provide inexpensive medical care: °Organization         Address  Phone   Notes  °[facility]  (336) 832-8035   °[facility]    (336) 832-7272   °[facility] [address] °[place], [place] [address] (336) 832-4777   °[facility] [address], °[place] (336) 271-4999   °[facility]    (336) 373-0678   °[facility]    (336) 272-1050   °[facility] ° [address], [place] Phone:  (336) 832-4444, Fax:  (336) 832-4440 Hours of Operation:  9 am - 6 pm, M-F.  Also accepts Medicaid/Medicare and self-pay.  °[facility] ° [address], [place] Phone: (336) 832-3150, Fax: (336) [phone]. Hours of Operation:  8:30 am - 5:30 pm, M-F.  Also accepts Medicaid and self-pay.  °[facility] [place] [address], [place] Phone: (336) 878-6027   °[facility] [address], [place], [place] (336)723-1848, Ext. 123 Mondays & Thursdays: 7-9 AM.  First 15 patients are seen on a first come, first serve basis. °  ° °Medicaid-accepting [place] Providers: ° °Organization         Address  Phone   Notes  °[facility] [address], [place] (336) 641-2100 Also accepts self-pay patients.  °[facility] [address], [place] ° (336) 856-9996   °[facility] [address] [place] [address], [place] (336) 288-8857   °[facility] [address] [place] [address], [place] (336) 299-7000   °Veita Bland [address], [place]  ° (336) 373-1557 Only  accepts [place] Access Medicaid patients after they have their name applied to their card.  ° °Self-Pay (no insurance) in [place]: ° °Organization         Address  Phone   Notes  °Sickle Cell Patients, [facility] [address], [place] (336) 832-1970   °[facility] [address], [place] (336) 832-4400   °[facility] [place] ° [address],  (336) 992-4800   °[facility]/Dr. Osei-Bonsu ° [address] [place] [address], [place] or [address], [place] (336) 841-8500 Phone number for both [place] and [place] locations is the same.  °[facility] [address], [place] (336) 299-0000   °[facility] [address] [place] [address], [place] or [address] (336) 852-7530 °(336) 878-2260   °[facility] [address], [place] (336) 350-1642, phone; (336) 294-5005,   fax Sees patients 1st and 3rd Saturday of every month.  Must not qualify for public or private insurance (i.e. Medicaid, Medicare, [place] Health Choice, Veterans' Benefits) • Household income should be no more than 200% of the poverty level •The clinic cannot treat you if you are pregnant or think you are pregnant • Sexually transmitted diseases are not treated at the clinic.  ° ° °Dental Care: °Organization         Address  Phone  Notes  °[facility] [address], [place] (336) 641-6152 Accepts children up to age 21 who are enrolled in Medicaid or [place] Health Choice; pregnant women with a Medicaid card; and children who have applied for Medicaid or [place] Health Choice, but were declined, whose parents can pay a reduced fee at time of service.  °[facility] [place]  [address], [place] (336) 641-7733 Accepts children up to age 21 who are enrolled in Medicaid or [place] Health Choice; pregnant  women with a Medicaid card; and children who have applied for Medicaid or [place] Health Choice, but were declined, whose parents can pay a reduced fee at time of service.  °[facility] ° [address], [place] (336) 641-4533 Patients are seen by appointment only. Walk-ins are not accepted. [facility] will see patients 18 years of age and older. °Monday - Tuesday (8am-5pm) °Most Wednesdays (8:30-5pm) °$30 per visit, cash only  °[facility] ° [address], [place] (336) 641-4533 Patients are seen by appointment only. Walk-ins are not accepted. [facility] will see patients 18 years of age and older. °One Wednesday Evening (Monthly: Volunteer Based).  $30 per visit, cash only  °[facility]  (919) 537-3737 for adults; Children under age 4, call Graduate Pediatric Dentistry at (919) [phone]. Children aged 4-14, please call (919) [phone] to request a pediatric application. ° Dental services are provided in all areas of dental care including fillings, crowns and bridges, complete and partial dentures, implants, gum treatment, root canals, and extractions. Preventive care is also provided. Treatment is provided to both adults and children. °Patients are selected via a lottery and there is often a waiting list. °  °[facility] [address], °[place] ° (336) 763-8833 [email or web address] °  °[facility] [address], [place], [place] (336)723-1848, Ext. 123 Second and Fourth Thursday of each month, opens at 6:30 AM; Clinic ends at 9 AM.  Patients are seen on a first-come first-served basis, and a limited number are seen during each clinic.  ° °[facility] ° [address], [place], [place] (336) 723-7904   Eligibility Requirements °You must have lived in [place], [place], or [place] counties for at least the last three months. °  You cannot be eligible for state or federal sponsored  healthcare insurance, including Veterans Administration, Medicaid, or Medicare. °  You generally cannot be eligible for healthcare insurance through your employer.  °  How to apply: °Eligibility screenings are held every Tuesday and Wednesday afternoon from 1:00 pm until 4:00 pm. You do not need an appointment for the interview!  °[facility] [address], [place], [place] 336-631-2330   °[facility]  336-342-8273   °[facility]  336-703-3100   °[facility]  336-570-6415   ° °Behavioral Health Resources in the Community: °Intensive   Outpatient Programs °Organization         Address  Phone  Notes  °[facility] [address], [place], [place] 336-878-6098   °[facility] [address], [place], [place] 336-832-9800   °[facility] [address], [place], [place] ° 336-882-2125   °[facility] [address],  °[place], [place] 1-800-853-5163 or 336-641-4981   °Substance Abuse Resources °Organization         Address  Phone  Notes  °[facility]  336-882-2125   °[facility]  336-784-9470   °[facility]  336-285-9073   °[facility]  336-845-3988   °Residential & Outpatient Substance Abuse Program  1-800-659-3381   °Psychological Services °Organization         Address  Phone  Notes  °[facility]  336- 832-9600   °[facility]  336- 378-7881   °[facility] [address], [place] 1-800-853-5163 or 336-641-4981   ° °Mobile Crisis Teams °Organization         Address  Phone  Notes  °[facility], Mobile Crisis Care Unit  1-877-626-1772   °Assertive °[facility] ° [address] [place], [place] 336-834-9664   °Sharon DeEsch [address] °[place] 336-554-5454   ° °Self-Help/Support Groups °Organization         Address  Phone              Notes  °[facility] [place] - variety of support groups  336- 373-1402 Call for more information  °Narcotics Anonymous (NA), [facility] [address], °[place]  2 meetings at this location  ° °Residential Treatment Programs °Organization         Address  Phone  Notes  °[facility] [address],    °[place]  1-866-801-8205   °[facility] ° [address], [place], [place] 704-293-8524   °[facility] [address], [place] 336-845-3988 Admissions: 8am-3pm M-F  °[facility] [address]    °[place], [place] 336-841-1104   °[facility] [address], [place], [place] 336-379-7146   °[facility] [address]  °[place], [place] 336-285-9073   °[facility] - Intensive Outpatient [address], [place], [place] 336-852-3033   °[facility] [address]  °[place], [place] 1-877-615-2722 or 336-784-9470   °[facility] [address] , [place] 336-227-7417 Accepts Medicaid  °[facility] [address]  ° [place] 1-800-659-3381 Substance Abuse/Addiction Treatment  ° °[place] Behavioral Health Resources °Organization         Address  Phone  Notes  °[facility]  (888) 581-9988   °Julie Brannon, PhD [address] [place], [place]   (336) 349-5553 or (336) 951-0000   °[facility]   [address] °[place], [place] (336) 349-4454   °[facility] [address], [place], [place] (336) 342-8316 Insurance/Medicaid/sponsorship through [facility]  °[facility] [address]                                    [place], [place] (336) 342-8316 Therapy/tele-psych/case  °[facility] [address]  ° , [place] (336) 349-2233    °Dr.   Arfeen  (336) 349-4544   °[facility] [place]  [employer] [place] [facility] 1) [address], [place] °2) [address], [place] °3)  [address], [place] (336) 349-3220 °(336) 342-7768 ° °(336) 342-8140   °[place] [facility] (336) 342-1394 or (336) 342-3537 (After Hours)    ° ° °

## 2016-01-03 ENCOUNTER — Emergency Department (HOSPITAL_COMMUNITY)
Admission: EM | Admit: 2016-01-03 | Discharge: 2016-01-04 | Disposition: A | Discharge status: Home / Self Care | Payer: Self-pay | Attending: Emergency Medicine | Admitting: Emergency Medicine

## 2016-01-03 ENCOUNTER — Encounter (HOSPITAL_COMMUNITY): Payer: Self-pay | Admitting: Emergency Medicine

## 2016-01-03 ENCOUNTER — Emergency Department (HOSPITAL_COMMUNITY): Payer: Self-pay

## 2016-01-03 DIAGNOSIS — R05 Cough: Secondary | ICD-10-CM | POA: Insufficient documentation

## 2016-01-03 DIAGNOSIS — Z8619 Personal history of other infectious and parasitic diseases: Secondary | ICD-10-CM | POA: Insufficient documentation

## 2016-01-03 DIAGNOSIS — Z79899 Other long term (current) drug therapy: Secondary | ICD-10-CM | POA: Insufficient documentation

## 2016-01-03 DIAGNOSIS — Z791 Long term (current) use of non-steroidal anti-inflammatories (NSAID): Secondary | ICD-10-CM | POA: Insufficient documentation

## 2016-01-03 DIAGNOSIS — R630 Anorexia: Secondary | ICD-10-CM | POA: Insufficient documentation

## 2016-01-03 DIAGNOSIS — R0789 Other chest pain: Secondary | ICD-10-CM | POA: Insufficient documentation

## 2016-01-03 NOTE — ED Notes (Signed)
Pt states she has had a cough along with central and right sided chest pain for over 2 weeks.

## 2016-01-04 LAB — CBC
HCT: 37.9 % (ref 36.0–46.0)
HEMOGLOBIN: 12.9 g/dL (ref 12.0–15.0)
MCH: 28.8 pg (ref 26.0–34.0)
MCHC: 34 g/dL (ref 30.0–36.0)
MCV: 84.6 fL (ref 78.0–100.0)
PLATELETS: 315 10*3/uL (ref 150–400)
RBC: 4.48 MIL/uL (ref 3.87–5.11)
RDW: 12.7 % (ref 11.5–15.5)
WBC: 9.2 10*3/uL (ref 4.0–10.5)

## 2016-01-04 LAB — BASIC METABOLIC PANEL
Anion gap: 11 (ref 5–15)
BUN: 14 mg/dL (ref 6–20)
CALCIUM: 8.3 mg/dL — AB (ref 8.9–10.3)
CO2: 21 mmol/L — ABNORMAL LOW (ref 22–32)
CREATININE: 0.79 mg/dL (ref 0.44–1.00)
Chloride: 109 mmol/L (ref 101–111)
GFR calc Af Amer: >60 mL/min (ref >60)
Glucose, Bld: 93 mg/dL (ref 65–99)
Potassium: 3.3 mmol/L — ABNORMAL LOW (ref 3.5–5.1)
SODIUM: 141 mmol/L (ref 135–145)

## 2016-01-04 MED ORDER — KETOROLAC TROMETHAMINE 60 MG/2ML IM SOLN
60.0000 mg | Freq: Once | INTRAMUSCULAR | Status: AC
  Administered 2016-01-04: 60 mg via INTRAMUSCULAR
  Filled 2016-01-04: qty 2

## 2016-01-04 MED ORDER — BENZONATATE 100 MG PO CAPS: 100.0000 mg | ORAL_CAPSULE | Freq: Three times a day (TID) | ORAL | Status: DC

## 2016-01-04 MED ORDER — POTASSIUM CHLORIDE CRYS ER 20 MEQ PO TBCR
20.0000 meq | EXTENDED_RELEASE_TABLET | Freq: Once | ORAL | Status: AC
  Administered 2016-01-04: 20 meq via ORAL
  Filled 2016-01-04: qty 1

## 2016-01-04 NOTE — Discharge Instructions (Signed)

## 2016-01-04 NOTE — ED Provider Notes (Signed)
CSN: [id number]     Arrival date & time 01/03/16  2246 History  By signing my name below, I, Soijett Blue, attest that this documentation has been prepared under the direction and in the presence of Tilden Fossa, MD. Electronically Signed: Soijett Blue, ED Scribe. 01/04/2016. 3:50 AM.   Chief Complaint  Patient presents with  . Chest Pain  . Cough      The history is provided by the patient. No language interpreter was used.    HPI Comments: Tara May is a 40 y.o. female  who presents to the Emergency Department complaining of right sided CP radiating to her right side onset 2 weeks. She states that her CP is worsened with lifting, movement, and deep breathing. Denies recent injury/trauma/broken bones/surgeries. She notes that she was seen on 12/24/2015 for right side pain and had imaging, labs, and was Rx ibuprofen and muscle relaxer which has not alleviated her symptoms. She states that she is having associated symptoms of dry intermittent cough, and appetite change. She states that she has tried Rx ibuprofen and muscle relaxer with no relief for her symptoms. She denies rash, fever, vomiting, leg swelling, and any other symptoms. Denies pregnancy at this time and notes that she has had a tubal ligation. Denies PMHx of DM, HTN, or blood clot. Denies smoking cigarettes or alcohol consumption.    Past Medical History  Diagnosis Date  . Hep C w/o coma, chronic (HCC)    Past Surgical History  Procedure Laterality Date  . Tubal ligation     No family history on file. Social History  Substance Use Topics  . Smoking status: Never Smoker   . Smokeless tobacco: None  . Alcohol Use: No   OB History    No data available     Review of Systems  Constitutional: Positive for appetite change. Negative for fever.  Respiratory: Positive for cough.   Cardiovascular: Positive for chest pain. Negative for leg swelling.  Gastrointestinal: Negative for vomiting.  Skin: Negative for rash.   All other systems reviewed and are negative.     Allergies  Review of patient's allergies indicates no known allergies.  Home Medications   Prior to Admission medications   Medication Sig Start Date End Date Taking? Authorizing Provider  ibuprofen (ADVIL,MOTRIN) 200 MG tablet Take 400 mg by mouth every 6 (six) hours as needed for moderate pain.   Yes Historical Provider, MD  ibuprofen (ADVIL,MOTRIN) 800 MG tablet Take 1 tablet (800 mg total) by mouth 3 (three) times daily. 12/24/15  Yes Arby Barrette, MD  orphenadrine (NORFLEX) 100 MG tablet Take 1 tablet (100 mg total) by mouth 2 (two) times daily. 12/24/15  Yes Arby Barrette, MD  benzonatate (TESSALON) 100 MG capsule Take 1 capsule (100 mg total) by mouth every 8 (eight) hours. 01/04/16   Tilden Fossa, MD   BP 124/86 mmHg  Pulse 68  Temp(Src) 98.2 F (36.8 C) (Oral)  Resp 16  Ht 5' (1.524 m)  Wt 155 lb (70.308 kg)  BMI 30.27 kg/m2  SpO2 100%  LMP 12/04/2015 Physical Exam  Constitutional: She is oriented to person, place, and time. She appears well-developed and well-nourished.  HENT:  Head: Normocephalic and atraumatic.  Cardiovascular: Normal rate and regular rhythm.   No murmur heard. Pulmonary/Chest: Effort normal and breath sounds normal. No respiratory distress. She exhibits tenderness.  moderate right sided chest wall tenderness without any rash.   Abdominal: Soft. There is no tenderness. There is no rebound and no  guarding.  Musculoskeletal: She exhibits no edema or tenderness.  Neurological: She is alert and oriented to person, place, and time.  Skin: Skin is warm and dry. No rash noted.  Psychiatric: She has a normal mood and affect. Her behavior is normal.  Nursing note and vitals reviewed.   ED Course  Procedures (including critical care time) DIAGNOSTIC STUDIES: Oxygen Saturation is 100% on RA, nl by my interpretation.    COORDINATION OF CARE: 3:50 AM Discussed treatment plan with pt at bedside which  includes labs, CXR, EKG, and pt agreed to plan.    Labs Review Labs Reviewed  BASIC METABOLIC PANEL - Abnormal; Notable for the following:    Potassium 3.3 (*)    CO2 21 (*)    Calcium 8.3 (*)    All other components within normal limits  CBC    Imaging Review Dg Chest 2 View  01/03/2016  CLINICAL DATA:  40 year old female with cough and chest pain EXAM: CHEST  2 VIEW COMPARISON:  Radiograph dated 12/24/2015 FINDINGS: The heart size and mediastinal contours are within normal limits. Both lungs are clear. The visualized skeletal structures are unremarkable. IMPRESSION: No active cardiopulmonary disease. Electronically Signed   By: Elgie Collard M.D.   On: 01/03/2016 23:46   I have personally reviewed and evaluated these images and lab results as part of my medical decision-making.   EKG Interpretation   Date/Time:  Wednesday January 03 2016 22:57:49 EST Ventricular Rate:  81 PR Interval:  114 QRS Duration: 94 QT Interval:  412 QTC Calculation: 478 R Axis:   50 Text Interpretation:  Sinus rhythm Borderline short PR interval Low  voltage, precordial leads Nonspecific T abnormalities, anterior leads  Confirmed by Lincoln Brigham [phone]) on 01/04/2016 1:03:03 AM      MDM   Final diagnoses:  Chest wall pain   Patient here for evaluation of right-sided chest pain. She is nontoxic appearing on examination with no respiratory distress. Examination reveals right-sided chest wall tenderness. Presentation is not consistent with PE the patient is perk negative. Presentation is also not consistent with pneumonia, ACS, dissection. Discussed with patient homecare for chest wall pain, patient follow-up, return precautions.  I personally performed the services described in this documentation, which was scribed in my presence. The recorded information has been reviewed and is accurate.    Tilden Fossa, MD 01/04/16 [phone]

## 2016-04-03 ENCOUNTER — Encounter (HOSPITAL_COMMUNITY): Payer: Self-pay | Admitting: Emergency Medicine

## 2016-04-03 ENCOUNTER — Emergency Department (HOSPITAL_COMMUNITY)
Admission: EM | Admit: 2016-04-03 | Discharge: 2016-04-03 | Disposition: A | Discharge status: Home / Self Care | Payer: Self-pay | Attending: Emergency Medicine | Admitting: Emergency Medicine

## 2016-04-03 DIAGNOSIS — H748X3 Other specified disorders of middle ear and mastoid, bilateral: Secondary | ICD-10-CM | POA: Insufficient documentation

## 2016-04-03 DIAGNOSIS — Z3202 Encounter for pregnancy test, result negative: Secondary | ICD-10-CM | POA: Insufficient documentation

## 2016-04-03 DIAGNOSIS — Z8619 Personal history of other infectious and parasitic diseases: Secondary | ICD-10-CM | POA: Insufficient documentation

## 2016-04-03 DIAGNOSIS — R112 Nausea with vomiting, unspecified: Secondary | ICD-10-CM | POA: Insufficient documentation

## 2016-04-03 DIAGNOSIS — J069 Acute upper respiratory infection, unspecified: Secondary | ICD-10-CM | POA: Insufficient documentation

## 2016-04-03 LAB — COMPREHENSIVE METABOLIC PANEL
ALT: 12 U/L — AB (ref 14–54)
AST: 17 U/L (ref 15–41)
Albumin: 3.3 g/dL — ABNORMAL LOW (ref 3.5–5.0)
Alkaline Phosphatase: 54 U/L (ref 38–126)
Anion gap: 10 (ref 5–15)
BUN: 6 mg/dL (ref 6–20)
CHLORIDE: 106 mmol/L (ref 101–111)
CO2: 22 mmol/L (ref 22–32)
CREATININE: 0.78 mg/dL (ref 0.44–1.00)
Calcium: 8.6 mg/dL — ABNORMAL LOW (ref 8.9–10.3)
GFR calc non Af Amer: >60 mL/min (ref >60)
Glucose, Bld: 117 mg/dL — ABNORMAL HIGH (ref 65–99)
POTASSIUM: 3.7 mmol/L (ref 3.5–5.1)
SODIUM: 138 mmol/L (ref 135–145)
Total Bilirubin: 0.8 mg/dL (ref 0.3–1.2)
Total Protein: 6.4 g/dL — ABNORMAL LOW (ref 6.5–8.1)

## 2016-04-03 LAB — URINALYSIS, ROUTINE W REFLEX MICROSCOPIC
Bilirubin Urine: NEGATIVE
GLUCOSE, UA: NEGATIVE mg/dL
HGB URINE DIPSTICK: NEGATIVE
Ketones, ur: NEGATIVE mg/dL
Nitrite: NEGATIVE
PH: 5.5 (ref 5.0–8.0)
PROTEIN: NEGATIVE mg/dL
SPECIFIC GRAVITY, URINE: 1.016 (ref 1.005–1.030)

## 2016-04-03 LAB — CBC
HEMATOCRIT: 39.1 % (ref 36.0–46.0)
Hemoglobin: 13 g/dL (ref 12.0–15.0)
MCH: 28.3 pg (ref 26.0–34.0)
MCHC: 33.2 g/dL (ref 30.0–36.0)
MCV: 85 fL (ref 78.0–100.0)
PLATELETS: 256 10*3/uL (ref 150–400)
RBC: 4.6 MIL/uL (ref 3.87–5.11)
RDW: 13.3 % (ref 11.5–15.5)
WBC: 11 10*3/uL — ABNORMAL HIGH (ref 4.0–10.5)

## 2016-04-03 LAB — URINE MICROSCOPIC-ADD ON

## 2016-04-03 LAB — LIPASE, BLOOD: LIPASE: 20 U/L (ref 11–51)

## 2016-04-03 LAB — I-STAT BETA HCG BLOOD, ED (MC, WL, AP ONLY): I-stat hCG, quantitative: <5 m[IU]/mL (ref <5)

## 2016-04-03 MED ORDER — BENZONATATE 100 MG PO CAPS: 100.0000 mg | ORAL_CAPSULE | Freq: Three times a day (TID) | ORAL | Status: DC | PRN

## 2016-04-03 MED ORDER — ONDANSETRON 4 MG PO TBDP
4.0000 mg | ORAL_TABLET | Freq: Once | ORAL | Status: AC
  Administered 2016-04-03: 4 mg via ORAL
  Filled 2016-04-03: qty 1

## 2016-04-03 MED ORDER — ONDANSETRON 4 MG PO TBDP: 4.0000 mg | ORAL_TABLET | Freq: Three times a day (TID) | ORAL | Status: DC | PRN

## 2016-04-03 MED ORDER — CETIRIZINE HCL 10 MG PO TABS: 10.0000 mg | ORAL_TABLET | Freq: Every day | ORAL | Status: DC

## 2016-04-03 NOTE — ED Notes (Signed)
Pt sts N/V starting last night with cough

## 2016-04-03 NOTE — ED Notes (Signed)
Pt ambulatory to restroom

## 2016-04-03 NOTE — ED Provider Notes (Signed)
CSN: [id number]     Arrival date & time 04/03/16  1412 History   First MD Initiated Contact with Patient 04/03/16 1701     Chief Complaint  Patient presents with  . Emesis    Tara May is a 40 y.o. female Who presents to the emergency department complaining of nausea and vomiting beginning last night with associated sneezing, nasal congestion, runny nose, postnasal drip and cough starting yesterday as well. She denies any fevers or abdominal pain. She reports she has vomited 3 times today. She has taken nothing for treatment of her symptoms today. She denies any shortness of breath. She denies fevers, abdominal pain, diarrhea, hematemesis, hematochezia, urinary symptoms, chest pain, shortness of breath, palpitations, sore throat or trouble swallowing.  The history is provided by the patient. No language interpreter was used.    Past Medical History  Diagnosis Date  . Hep C w/o coma, chronic (HCC)    Past Surgical History  Procedure Laterality Date  . Tubal ligation     History reviewed. No pertinent family history. Social History  Substance Use Topics  . Smoking status: Never Smoker   . Smokeless tobacco: None  . Alcohol Use: No   OB History    No data available     Review of Systems  Constitutional: Negative for fever and chills.  HENT: Positive for congestion, postnasal drip, rhinorrhea and sneezing. Negative for ear pain, sore throat and trouble swallowing.   Eyes: Negative for visual disturbance.  Respiratory: Positive for cough. Negative for shortness of breath and wheezing.   Cardiovascular: Negative for chest pain.  Gastrointestinal: Positive for nausea and vomiting. Negative for abdominal pain, diarrhea and blood in stool.  Genitourinary: Negative for dysuria, urgency, frequency, hematuria and difficulty urinating.  Musculoskeletal: Negative for back pain, neck pain and neck stiffness.  Skin: Negative for rash.  Neurological: Negative for dizziness,  light-headedness and headaches.      Allergies  Review of patient's allergies indicates no known allergies.  Home Medications   Prior to Admission medications   Medication Sig Start Date End Date Taking? Authorizing Provider  ibuprofen (ADVIL,MOTRIN) 200 MG tablet Take 400 mg by mouth every 6 (six) hours as needed for moderate pain.   Yes Historical Provider, MD  benzonatate (TESSALON) 100 MG capsule Take 1 capsule (100 mg total) by mouth 3 (three) times daily as needed for cough. 04/03/16   Everlene Farrier, PA-C  cetirizine (ZYRTEC ALLERGY) 10 MG tablet Take 1 tablet (10 mg total) by mouth daily. 04/03/16   Everlene Farrier, PA-C  ibuprofen (ADVIL,MOTRIN) 800 MG tablet Take 1 tablet (800 mg total) by mouth 3 (three) times daily. Patient not taking: Reported on 04/03/2016 12/24/15   Arby Barrette, MD  ondansetron (ZOFRAN ODT) 4 MG disintegrating tablet Take 1 tablet (4 mg total) by mouth every 8 (eight) hours as needed for nausea or vomiting. 04/03/16   Everlene Farrier, PA-C  orphenadrine (NORFLEX) 100 MG tablet Take 1 tablet (100 mg total) by mouth 2 (two) times daily. Patient not taking: Reported on 04/03/2016 12/24/15   Arby Barrette, MD   BP 122/79 mmHg  Pulse 87  Temp(Src) 99.1 F (37.3 C) (Oral)  Resp 18  SpO2 100% Physical Exam  Constitutional: She appears well-developed and well-nourished. No distress.  Nontoxic appearing.  HENT:  Head: Normocephalic and atraumatic.  Right Ear: External ear normal.  Left Ear: External ear normal.  Mouth/Throat: Oropharynx is clear and moist. No oropharyngeal exudate.  Mild middle ear effusion noted  bilaterally. No TM erythema or loss of landmarks. Boggy nasal turbinates bilaterally. No tonsillar hypertrophy or exudates.  Eyes: Conjunctivae are normal. Pupils are equal, round, and reactive to light. Right eye exhibits no discharge. Left eye exhibits no discharge.  Neck: Neck supple.  Cardiovascular: Normal rate, regular rhythm, normal heart sounds  and intact distal pulses.  Exam reveals no gallop and no friction rub.   No murmur heard. Pulmonary/Chest: Effort normal and breath sounds normal. No respiratory distress. She has no wheezes. She has no rales. She exhibits no tenderness.  Lungs are clear to auscultation bilaterally. Oxygen saturation is 100% on room air.  Abdominal: Soft. Bowel sounds are normal. She exhibits no distension. There is no tenderness. There is no rebound and no guarding.  Abdomen is soft and nontender to palpation. Bowel sounds are present. No peritoneal signs. No psoas or obturator sign. No CVA or flank tenderness.  Musculoskeletal: She exhibits no edema.  Lymphadenopathy:    She has no cervical adenopathy.  Neurological: She is alert. Coordination normal.  Skin: Skin is warm and dry. No rash noted. She is not diaphoretic. No erythema. No pallor.  Psychiatric: She has a normal mood and affect. Her behavior is normal.  Nursing note and vitals reviewed.   ED Course  Procedures (including critical care time) Labs Review Labs Reviewed  COMPREHENSIVE METABOLIC PANEL - Abnormal; Notable for the following:    Glucose, Bld 117 (*)    Calcium 8.6 (*)    Total Protein 6.4 (*)    Albumin 3.3 (*)    ALT 12 (*)    All other components within normal limits  CBC - Abnormal; Notable for the following:    WBC 11.0 (*)    All other components within normal limits  URINALYSIS, ROUTINE W REFLEX MICROSCOPIC (NOT AT [facility]) - Abnormal; Notable for the following:    APPearance CLOUDY (*)    Leukocytes, UA SMALL (*)    All other components within normal limits  URINE MICROSCOPIC-ADD ON - Abnormal; Notable for the following:    Squamous Epithelial / LPF 6-30 (*)    Bacteria, UA RARE (*)    All other components within normal limits  LIPASE, BLOOD  I-STAT BETA HCG BLOOD, ED ([facility], [facility], [facility] ONLY)    Imaging Review No results found. I have personally reviewed and evaluated these lab results as part of my medical  decision-making.   EKG Interpretation None      Filed Vitals:   04/03/16 1422  BP: 122/79  Pulse: 87  Temp: 99.1 F (37.3 C)  TempSrc: Oral  Resp: 18  SpO2: 100%     MDM   Meds given in ED:  Medications  ondansetron (ZOFRAN-ODT) disintegrating tablet 4 mg (4 mg Oral Given 04/03/16 1805)    New Prescriptions   BENZONATATE (TESSALON) 100 MG CAPSULE    Take 1 capsule (100 mg total) by mouth 3 (three) times daily as needed for cough.   CETIRIZINE (ZYRTEC ALLERGY) 10 MG TABLET    Take 1 tablet (10 mg total) by mouth daily.   ONDANSETRON (ZOFRAN ODT) 4 MG DISINTEGRATING TABLET    Take 1 tablet (4 mg total) by mouth every 8 (eight) hours as needed for nausea or vomiting.    Final diagnoses:  Non-intractable vomiting with nausea, vomiting of unspecified type  URI (upper respiratory infection)   This is a 40 y.o. female Who presents to the emergency department complaining of nausea and vomiting beginning last night with associated sneezing,  nasal congestion, runny nose, postnasal drip and cough starting yesterday as well. She denies any fevers or abdominal pain. She reports she has vomited 3 times today. She has taken nothing for treatment of her symptoms today. She denies any shortness of breath. On exam the patient is afebrile nontoxic appearing. Her abdomen is soft and nontender to palpation. Lungs are clear to auscultation bilaterally. She does have boggy nasal turbinates bilaterally. Throat is clear. Negative pregnancy test. Urinalysis shows small leukocytes and is nitrite negative. Patient denies any urinary symptoms. Lipase is within normal limits. CMP is unremarkable. CBC is remarkable only for white count of 11,000. Exam and blood work are reassuring. Patient was provided with Zofran and tolerating water prior to discharge. Repeat abdominal exam is benign and nontender to palpation. We'll discharge with prescriptions for Zofran as well as Zyrtec and Tessalon Perles for her nasal  congestion and cough. I discussed strict and specific return precautions. I advised the patient to follow-up with their primary care provider this week. I advised the patient to return to the emergency department with new or worsening symptoms or new concerns. The patient verbalized understanding and agreement with plan.       Everlene FarrierWilliam Noelly Lasseigne, PA-C 04/03/16 1826  Benjiman CoreNathan Pickering, MD 04/04/16 [phone]

## 2016-04-03 NOTE — Discharge Instructions (Signed)
Nausea and Vomiting °Nausea is a sick feeling that often comes before throwing up (vomiting). Vomiting is a reflex where stomach contents come out of your mouth. Vomiting can cause severe loss of body fluids (dehydration). Children and elderly adults can become dehydrated quickly, especially if they also have diarrhea. Nausea and vomiting are symptoms of a condition or disease. It is important to find the cause of your symptoms. °CAUSES  °· Direct irritation of the stomach lining. This irritation can result from increased acid production (gastroesophageal reflux disease), infection, food poisoning, taking certain medicines (such as nonsteroidal anti-inflammatory drugs), alcohol use, or tobacco use. °· Signals from the brain. These signals could be caused by a headache, heat exposure, an inner ear disturbance, increased pressure in the brain from injury, infection, a tumor, or a concussion, pain, emotional stimulus, or metabolic problems. °· An obstruction in the gastrointestinal tract (bowel obstruction). °· Illnesses such as diabetes, hepatitis, gallbladder problems, appendicitis, kidney problems, cancer, sepsis, atypical symptoms of a heart attack, or eating disorders. °· Medical treatments such as chemotherapy and radiation. °· Receiving medicine that makes you sleep (general anesthetic) during surgery. °DIAGNOSIS °Your caregiver may ask for tests to be done if the problems do not improve after a few days. Tests may also be done if symptoms are severe or if the reason for the nausea and vomiting is not clear. Tests may include: °· Urine tests. °· Blood tests. °· Stool tests. °· Cultures (to look for evidence of infection). °· X-rays or other imaging studies. °Test results can help your caregiver make decisions about treatment or the need for additional tests. °TREATMENT °You need to stay well hydrated. Drink frequently but in small amounts. You may wish to drink water, sports drinks, clear broth, or eat frozen  ice pops or gelatin dessert to help stay hydrated. When you eat, eating slowly may help prevent nausea. There are also some antinausea medicines that may help prevent nausea. °HOME CARE INSTRUCTIONS  °· Take all medicine as directed by your caregiver. °· If you do not have an appetite, do not force yourself to eat. However, you must continue to drink fluids. °· If you have an appetite, eat a normal diet unless your caregiver tells you differently. °¨ Eat a variety of complex carbohydrates (rice, wheat, potatoes, bread), lean meats, yogurt, fruits, and vegetables. °¨ Avoid high-fat foods because they are more difficult to digest. °· Drink enough water and fluids to keep your urine clear or pale yellow. °· If you are dehydrated, ask your caregiver for specific rehydration instructions. Signs of dehydration may include: °¨ Severe thirst. °¨ Dry lips and mouth. °¨ Dizziness. °¨ Dark urine. °¨ Decreasing urine frequency and amount. °¨ Confusion. °¨ Rapid breathing or pulse. °SEEK IMMEDIATE MEDICAL CARE IF:  °· You have blood or brown flecks (like coffee grounds) in your vomit. °· You have black or bloody stools. °· You have a severe headache or stiff neck. °· You are confused. °· You have severe abdominal pain. °· You have chest pain or trouble breathing. °· You do not urinate at least once every 8 hours. °· You develop cold or clammy skin. °· You continue to vomit for longer than 24 to 48 hours. °· You have a fever. °MAKE SURE YOU:  °· Understand these instructions. °· Will watch your condition. °· Will get help right away if you are not doing well or get worse. °  °This information is not intended to replace advice given to you by your health care provider. Make sure   you discuss any questions you have with your health care provider. °  °Document Released: 12/02/2005 Document Revised: 02/24/2012 Document Reviewed: 05/01/2011 °Elsevier Interactive Patient Education ©2016 [employer] ° °Upper Respiratory Infection,  Adult °Most upper respiratory infections (URIs) are a viral infection of the air passages leading to the lungs. A URI affects the nose, throat, and upper air passages. The most common type of URI is nasopharyngitis and is typically referred to as "the common cold." °URIs run their course and usually go away on their own. Most of the time, a URI does not require medical attention, but sometimes a bacterial infection in the upper airways can follow a viral infection. This is called a secondary infection. Sinus and middle ear infections are common types of secondary upper respiratory infections. °Bacterial pneumonia can also complicate a URI. A URI can worsen asthma and chronic obstructive pulmonary disease (COPD). Sometimes, these complications can require emergency medical care and may be life threatening.  °CAUSES °Almost all URIs are caused by viruses. A virus is a type of germ and can spread from one person to another.  °RISKS FACTORS °You may be at risk for a URI if:  °· You smoke.   °· You have chronic heart or lung disease. °· You have a weakened defense (immune) system.   °· You are very young or very old.   °· You have nasal allergies or asthma. °· You work in crowded or poorly ventilated areas. °· You work in health care facilities or schools. °SIGNS AND SYMPTOMS  °Symptoms typically develop 2-3 days after you come in contact with a cold virus. Most viral URIs last 7-10 days. However, viral URIs from the influenza virus (flu virus) can last 14-18 days and are typically more severe. Symptoms may include:  °· Runny or stuffy (congested) nose.   °· Sneezing.   °· Cough.   °· Sore throat.   °· Headache.   °· Fatigue.   °· Fever.   °· Loss of appetite.   °· Pain in your forehead, behind your eyes, and over your cheekbones (sinus pain). °· Muscle aches.   °DIAGNOSIS  °Your health care provider may diagnose a URI by: °· Physical exam. °· Tests to check that your symptoms are not due to another condition such  as: °¨ Strep throat. °¨ Sinusitis. °¨ Pneumonia. °¨ Asthma. °TREATMENT  °A URI goes away on its own with time. It cannot be cured with medicines, but medicines may be prescribed or recommended to relieve symptoms. Medicines may help: °· Reduce your fever. °· Reduce your cough. °· Relieve nasal congestion. °HOME CARE INSTRUCTIONS  °· Take medicines only as directed by your health care provider.   °· Gargle warm saltwater or take cough drops to comfort your throat as directed by your health care provider. °· Use a warm mist humidifier or inhale steam from a shower to increase air moisture. This may make it easier to breathe. °· Drink enough fluid to keep your urine clear or pale yellow.   °· Eat soups and other clear broths and maintain good nutrition.   °· Rest as needed.   °· Return to work when your temperature has returned to normal or as your health care provider advises. You may need to stay home longer to avoid infecting others. You can also use a face mask and careful hand washing to prevent spread of the virus. °· Increase the usage of your inhaler if you have asthma.   °· Do not use any tobacco products, including cigarettes, chewing tobacco, or electronic cigarettes. If you need help quitting, ask your   health care provider. °PREVENTION  °The best way to protect yourself from getting a cold is to practice good hygiene.  °· Avoid oral or hand contact with people with cold symptoms.   °· Wash your hands often if contact occurs.   °There is no clear evidence that vitamin C, vitamin E, echinacea, or exercise reduces the chance of developing a cold. However, it is always recommended to get plenty of rest, exercise, and practice good nutrition.  °SEEK MEDICAL CARE IF:  °· You are getting worse rather than better.   °· Your symptoms are not controlled by medicine.   °· You have chills. °· You have worsening shortness of breath. °· You have brown or red mucus. °· You have yellow or brown nasal discharge. °· You have  pain in your face, especially when you bend forward. °· You have a fever. °· You have swollen neck glands. °· You have pain while swallowing. °· You have white areas in the back of your throat. °SEEK IMMEDIATE MEDICAL CARE IF:  °· You have severe or persistent: °¨ Headache. °¨ Ear pain. °¨ Sinus pain. °¨ Chest pain. °· You have chronic lung disease and any of the following: °¨ Wheezing. °¨ Prolonged cough. °¨ Coughing up blood. °¨ A change in your usual mucus. °· You have a stiff neck. °· You have changes in your: °¨ Vision. °¨ Hearing. °¨ Thinking. °¨ Mood. °MAKE SURE YOU:  °· Understand these instructions. °· Will watch your condition. °· Will get help right away if you are not doing well or get worse. °  °This information is not intended to replace advice given to you by your health care provider. Make sure you discuss any questions you have with your health care provider. °  °Document Released: 05/28/2001 Document Revised: 04/18/2015 Document Reviewed: 03/09/2014 °Elsevier Interactive Patient Education ©2016 [employer] ° °

## 2016-04-03 NOTE — ED Notes (Signed)
Pt given water for PO trial.

## 2016-11-14 ENCOUNTER — Encounter (HOSPITAL_COMMUNITY): Payer: Self-pay | Admitting: Family Medicine

## 2016-11-14 ENCOUNTER — Emergency Department (HOSPITAL_COMMUNITY)
Admission: EM | Admit: 2016-11-14 | Discharge: 2016-11-14 | Disposition: A | Discharge status: Home / Self Care | Payer: Self-pay | Attending: Emergency Medicine | Admitting: Emergency Medicine

## 2016-11-14 DIAGNOSIS — J069 Acute upper respiratory infection, unspecified: Secondary | ICD-10-CM | POA: Insufficient documentation

## 2016-11-14 LAB — URINALYSIS, ROUTINE W REFLEX MICROSCOPIC
BILIRUBIN URINE: NEGATIVE
GLUCOSE, UA: NEGATIVE mg/dL
HGB URINE DIPSTICK: NEGATIVE
Ketones, ur: NEGATIVE mg/dL
Leukocytes, UA: NEGATIVE
Nitrite: NEGATIVE
PH: 5.5 (ref 5.0–8.0)
Protein, ur: NEGATIVE mg/dL
SPECIFIC GRAVITY, URINE: 1.029 (ref 1.005–1.030)

## 2016-11-14 LAB — CBC
HCT: 37.6 % (ref 36.0–46.0)
HEMOGLOBIN: 12.9 g/dL (ref 12.0–15.0)
MCH: 29.2 pg (ref 26.0–34.0)
MCHC: 34.3 g/dL (ref 30.0–36.0)
MCV: 85.1 fL (ref 78.0–100.0)
Platelets: 284 10*3/uL (ref 150–400)
RBC: 4.42 MIL/uL (ref 3.87–5.11)
RDW: 13 % (ref 11.5–15.5)
WBC: 6 10*3/uL (ref 4.0–10.5)

## 2016-11-14 LAB — COMPREHENSIVE METABOLIC PANEL
ALBUMIN: 3.5 g/dL (ref 3.5–5.0)
ALK PHOS: 49 U/L (ref 38–126)
ALT: 17 U/L (ref 14–54)
ANION GAP: 6 (ref 5–15)
AST: 22 U/L (ref 15–41)
BUN: 10 mg/dL (ref 6–20)
CALCIUM: 7.7 mg/dL — AB (ref 8.9–10.3)
CHLORIDE: 108 mmol/L (ref 101–111)
CO2: 22 mmol/L (ref 22–32)
Creatinine, Ser: 0.86 mg/dL (ref 0.44–1.00)
GFR calc Af Amer: >60 mL/min (ref >60)
GFR calc non Af Amer: >60 mL/min (ref >60)
GLUCOSE: 146 mg/dL — AB (ref 65–99)
Potassium: 3.3 mmol/L — ABNORMAL LOW (ref 3.5–5.1)
SODIUM: 136 mmol/L (ref 135–145)
Total Bilirubin: 0.7 mg/dL (ref 0.3–1.2)
Total Protein: 6.7 g/dL (ref 6.5–8.1)

## 2016-11-14 LAB — LIPASE, BLOOD: LIPASE: 25 U/L (ref 11–51)

## 2016-11-14 LAB — POC URINE PREG, ED: PREG TEST UR: NEGATIVE

## 2016-11-14 NOTE — ED Provider Notes (Signed)
[facility]-EMERGENCY DEPT Provider Note   CSN: [id number] Arrival date & time: 11/14/16  0004     History   Chief Complaint Chief Complaint  Patient presents with  . Cough  . Fever  . Emesis  . Diarrhea    HPI Tara May is a 40 y.o. female.  Patient is a 40 year old female with no significant past medical history. She presents with a 24-hour history of chest congestion and cough fever, aches, vomiting and loose stool. She reports that her granddaughter has been ill with a similar array of symptoms.   The history is provided by the patient.  Cough  This is a new problem. The current episode started yesterday. The problem occurs constantly. The problem has been gradually worsening. The cough is productive of sputum. Maximum temperature: Subjective fevers.  Fever   Associated symptoms include diarrhea, vomiting and cough.  Emesis   Associated symptoms include cough, diarrhea and a fever.  Diarrhea   Associated symptoms include vomiting and cough.    Past Medical History:  Diagnosis Date  . Hep C w/o coma, chronic (HCC)     There are no active problems to display for this patient.   Past Surgical History:  Procedure Laterality Date  . TUBAL LIGATION      OB History    No data available       Home Medications    Prior to Admission medications   Medication Sig Start Date End Date Taking? Authorizing Provider  benzonatate (TESSALON) 100 MG capsule Take 1 capsule (100 mg total) by mouth 3 (three) times daily as needed for cough. 04/03/16   Everlene FarrierWilliam Dansie, PA-C  cetirizine (ZYRTEC ALLERGY) 10 MG tablet Take 1 tablet (10 mg total) by mouth daily. 04/03/16   Everlene FarrierWilliam Dansie, PA-C  ibuprofen (ADVIL,MOTRIN) 200 MG tablet Take 400 mg by mouth every 6 (six) hours as needed for moderate pain.    Historical Provider, MD  ibuprofen (ADVIL,MOTRIN) 800 MG tablet Take 1 tablet (800 mg total) by mouth 3 (three) times daily. Patient not taking: Reported on 04/03/2016 12/24/15    Arby BarretteMarcy Pfeiffer, MD  ondansetron (ZOFRAN ODT) 4 MG disintegrating tablet Take 1 tablet (4 mg total) by mouth every 8 (eight) hours as needed for nausea or vomiting. 04/03/16   Everlene FarrierWilliam Dansie, PA-C  orphenadrine (NORFLEX) 100 MG tablet Take 1 tablet (100 mg total) by mouth 2 (two) times daily. Patient not taking: Reported on 04/03/2016 12/24/15   Arby BarretteMarcy Pfeiffer, MD    Family History History reviewed. No pertinent family history.  Social History Social History  Substance Use Topics  . Smoking status: Never Smoker  . Smokeless tobacco: Never Used  . Alcohol use No     Allergies   Patient has no known allergies.   Review of Systems Review of Systems  Constitutional: Positive for fever.  Respiratory: Positive for cough.   Gastrointestinal: Positive for diarrhea and vomiting.  All other systems reviewed and are negative.    Physical Exam Updated Vital Signs BP 130/82 (BP Location: Left Arm)   Pulse 79   Temp 98.2 F (36.8 C) (Oral)   Resp 20   Ht 5' (1.524 m)   Wt 150 lb (68 kg)   SpO2 99%   BMI 29.29 kg/m   Physical Exam  Constitutional: She is oriented to person, place, and time. She appears well-developed and well-nourished. No distress.  HENT:  Head: Normocephalic and atraumatic.  Mouth/Throat: Oropharynx is clear and moist.  TMs are clear bilaterally.  Neck: Normal range of motion. Neck supple.  Cardiovascular: Normal rate and regular rhythm.  Exam reveals no gallop and no friction rub.   No murmur heard. Pulmonary/Chest: Effort normal and breath sounds normal. No respiratory distress. She has no wheezes.  Abdominal: Soft. Bowel sounds are normal. She exhibits no distension. There is no tenderness.  Musculoskeletal: Normal range of motion.  Lymphadenopathy:    She has no cervical adenopathy.  Neurological: She is alert and oriented to person, place, and time.  Skin: Skin is warm and dry. She is not diaphoretic.  Nursing note and vitals reviewed.    ED  Treatments / Results  Labs (all labs ordered are listed, but only abnormal results are displayed) Labs Reviewed  COMPREHENSIVE METABOLIC PANEL - Abnormal; Notable for the following:       Result Value   Potassium 3.3 (*)    Glucose, Bld 146 (*)    Calcium 7.7 (*)    All other components within normal limits  LIPASE, BLOOD  CBC  URINALYSIS, ROUTINE W REFLEX MICROSCOPIC (NOT AT [facility])  POC URINE PREG, ED    EKG  EKG Interpretation None       Radiology No results found.  Procedures Procedures (including critical care time)  Medications Ordered in ED Medications - No data to display   Initial Impression / Assessment and Plan / ED Course  I have reviewed the triage vital signs and the nursing notes.  Pertinent labs & imaging results that were available during my care of the patient were reviewed by me and considered in my medical decision making (see chart for details).  Clinical Course     Symptoms most likely viral in nature. Will recommend over-the-counter medications, plenty of fluids, and when necessary return.  Final Clinical Impressions(s) / ED Diagnoses   Final diagnoses:  None    New Prescriptions New Prescriptions   No medications on file     Geoffery Lyonsouglas Ananda Sitzer, MD 11/14/16 [phone]

## 2016-11-14 NOTE — ED Triage Notes (Signed)
Patient reports yesterday she developed a productive cough, fever, nausea, vomiting, and diarrhea. Also, states she has been around her grandson who has the same symptoms. Pt has took OTC cold medication and hot tea with honey.

## 2016-11-14 NOTE — Discharge Instructions (Signed)
Over-the-counter medications as needed for symptom relief.  Drink plenty of fluids and get plenty of rest.  Return to the emergency department if your symptoms significantly worsen or change.

## 2017-01-22 ENCOUNTER — Encounter (HOSPITAL_COMMUNITY): Payer: Self-pay

## 2017-01-22 ENCOUNTER — Emergency Department (HOSPITAL_COMMUNITY)
Admission: EM | Admit: 2017-01-22 | Discharge: 2017-01-22 | Disposition: A | Discharge status: Home / Self Care | Payer: Self-pay | Attending: Emergency Medicine | Admitting: Emergency Medicine

## 2017-01-22 DIAGNOSIS — L259 Unspecified contact dermatitis, unspecified cause: Secondary | ICD-10-CM

## 2017-01-22 DIAGNOSIS — Z79899 Other long term (current) drug therapy: Secondary | ICD-10-CM | POA: Insufficient documentation

## 2017-01-22 DIAGNOSIS — R21 Rash and other nonspecific skin eruption: Secondary | ICD-10-CM

## 2017-01-22 MED ORDER — TRIAMCINOLONE ACETONIDE 0.1 % EX CREA: 1.0000 "application " | TOPICAL_CREAM | Freq: Two times a day (BID) | CUTANEOUS | 0 refills | Status: DC

## 2017-01-22 MED ORDER — CEPHALEXIN 500 MG PO CAPS: 500.0000 mg | ORAL_CAPSULE | Freq: Four times a day (QID) | ORAL | 0 refills | Status: DC

## 2017-01-22 MED ORDER — MUPIROCIN CALCIUM 2 % NA OINT: TOPICAL_OINTMENT | NASAL | 0 refills | Status: DC

## 2017-01-22 NOTE — ED Notes (Signed)
C/o rash upper chest onset Fri. States she hasn't used any new products. Only on her upper chest no other place on her body. States cold water does make it worse , c/o burning and itching using cortisone cream without relief.

## 2017-01-22 NOTE — ED Provider Notes (Signed)
[facility]-EMERGENCY DEPT Provider Note   CSN: [id number] Arrival date & time: 01/22/17  0057     History   Chief Complaint Chief Complaint  Patient presents with  . Rash    HPI Tara May is a 41 y.o. female with a past medical history of hepatitis C s/p treatment now undetectable who presents with a rash. Patient states that 5 days ago she began developing a red, bumpy rash along her anterior chest wall. Patient states the rash has not progressed and is tracking up her neck and under her chin. The rational developing pustules, is very pruritic and has a burning sensation. Burning sensation is worsened with exposure to cold. She has been applying hydrocortisone cream with minimal relief. She denies any flulike symptoms, new exposures, detergents, lotions. 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 She denies any fevers, chills, vomiting, recent travel.  HPI  Past Medical History:  Diagnosis Date  . Hep C w/o coma, chronic (HCC)     There are no active problems to display for this patient.   Past Surgical History:  Procedure Laterality Date  . TUBAL LIGATION      OB History    No data available       Home Medications    Prior to Admission medications   Medication Sig Start Date End Date Taking? Authorizing Provider  benzonatate (TESSALON) 100 MG capsule Take 1 capsule (100 mg total) by mouth 3 (three) times daily as needed for cough. 04/03/16   Everlene Farrier, PA-C  cetirizine (ZYRTEC ALLERGY) 10 MG tablet Take 1 tablet (10 mg total) by mouth daily. 04/03/16   Everlene Farrier, PA-C  ibuprofen (ADVIL,MOTRIN) 200 MG tablet Take 400 mg by mouth every 6 (six) hours as needed for moderate pain.    Historical Provider, MD  ibuprofen (ADVIL,MOTRIN) 800 MG tablet Take 1 tablet (800 mg total) by mouth 3 (three) times daily. Patient not taking: Reported on 04/03/2016 12/24/15   Arby Barrette, MD  ondansetron (ZOFRAN ODT) 4 MG disintegrating tablet Take 1 tablet (4 mg total) by mouth every  8 (eight) hours as needed for nausea or vomiting. 04/03/16   Everlene Farrier, PA-C  orphenadrine (NORFLEX) 100 MG tablet Take 1 tablet (100 mg total) by mouth 2 (two) times daily. Patient not taking: Reported on 04/03/2016 12/24/15   Arby Barrette, MD    Family History No family history on file.  Social History Social History  Substance Use Topics  . Smoking status: Never Smoker  . Smokeless tobacco: Never Used  . Alcohol use No     Allergies   Patient has no known allergies.   Review of Systems Review of Systems  All other systems reviewed and are negative.    Physical Exam Updated Vital Signs BP 115/74   Pulse 72   Temp 97.6 F (36.4 C) (Oral)   Resp 16   Ht 5' (1.524 m)   Wt 72.6 kg   SpO2 100%   BMI 31.25 kg/m   Physical Exam  Constitutional: She is oriented to person, place, and time. She appears well-developed and well-nourished. No distress.  HENT:  Head: Normocephalic and atraumatic.  Mouth/Throat: Oropharynx is clear and moist. No oropharyngeal exudate.  Eyes: Conjunctivae and EOM are normal. Pupils are equal, round, and reactive to light. Right eye exhibits no discharge. Left eye exhibits no discharge. No scleral icterus.  Cardiovascular: Normal rate.   Pulmonary/Chest: Effort normal.  Neurological: She is alert and oriented to person, place, and time. Coordination  normal.  Skin: Skin is warm and dry. Rash noted. She is not diaphoretic. No erythema. No pallor.  Psychiatric: She has a normal mood and affect. Her behavior is normal.  Nursing note and vitals reviewed.      ED Treatments / Results  Labs (all labs ordered are listed, but only abnormal results are displayed) Labs Reviewed - No data to display  EKG  EKG Interpretation None       Radiology No results found.  Procedures Procedures (including critical care time)  Medications Ordered in ED Medications - No data to display   Initial Impression / Assessment and Plan / ED Course    I have reviewed the triage vital signs and the nursing notes.  Pertinent labs & imaging results that were available during my care of the patient were reviewed by me and considered in my medical decision making (see chart for details).     Otherwise healthy 41 y.o F presents with rash c/w contact dermatitis. Patient denies known history of new exposures however she does work at a nursing home. See picture as above. Doubt shingles as it crosses the midline. There appears to be superimposed bacterial infection as well. Will cover with Keflex and Bactroban ointment. Vital signs are stable, no fever. No sore throat or airway swelling. No difficulty breathing, swallowing or talking. Recommend that patient take Benadryl as needed for itch. Kenalog cream provided as well, avoid applying this to the face. Return precautions outlined in patient discharge instructions.  Patient was discussed with and seen by Dr. Clydene PughKnott who agrees with the treatment plan.    Final Clinical Impressions(s) / ED Diagnoses   Final diagnoses:  Rash  Contact dermatitis, unspecified contact dermatitis type, unspecified trigger    New Prescriptions New Prescriptions   No medications on file     Dub MikesSamantha Tripp Andrewjames Weirauch, PA-C 01/22/17 1110    Lyndal Pulleyaniel Knott, MD 01/22/17 [phone]

## 2017-01-22 NOTE — ED Triage Notes (Signed)
Pt states that rash on chest and neck started on Friday, pt states rash is getting worse, denies SOB

## 2017-01-22 NOTE — Discharge Instructions (Signed)
Take antibiotic as prescribed. Alternate applying Kenalog cream and Bactroban. Wash hands thoroughly. Follow up with her primary care provider if her symptoms worsen in anyway or do not improve. Return to the emergency department a few experience significant worsening of her symptoms, spreading of the rash, fevers, difficulty breathing or swallowing.

## 2017-01-25 ENCOUNTER — Emergency Department (HOSPITAL_COMMUNITY)
Admission: EM | Admit: 2017-01-25 | Discharge: 2017-01-25 | Disposition: A | Discharge status: Home / Self Care | Payer: Self-pay | Attending: Emergency Medicine | Admitting: Emergency Medicine

## 2017-01-25 ENCOUNTER — Encounter (HOSPITAL_COMMUNITY): Payer: Self-pay | Admitting: *Deleted

## 2017-01-25 DIAGNOSIS — R21 Rash and other nonspecific skin eruption: Secondary | ICD-10-CM | POA: Insufficient documentation

## 2017-01-25 MED ORDER — TRIAMCINOLONE ACETONIDE 0.025 % EX OINT: 1.0000 "application " | TOPICAL_OINTMENT | Freq: Two times a day (BID) | CUTANEOUS | 0 refills | Status: DC

## 2017-01-25 MED ORDER — PREDNISONE 20 MG PO TABS: ORAL_TABLET | ORAL | 0 refills | Status: DC

## 2017-01-25 MED ORDER — PERMETHRIN 5 % EX CREA: TOPICAL_CREAM | CUTANEOUS | 1 refills | Status: DC

## 2017-01-25 NOTE — ED Triage Notes (Signed)
Pt reports rash on her chest for about 1 week. [facility], dx with dermatitis, has been taking abx but not using cream because it made symptoms worse. She says that the rash has now developed on her legs.

## 2017-01-25 NOTE — ED Provider Notes (Signed)
[facility]-EMERGENCY DEPT Provider Note   CSN: [id number] Arrival date & time: 01/25/17  1600  By signing my name below, I, Linna Darner, attest that this documentation has been prepared under the direction and in the presence of Arthor Captain, PA-C. Electronically Signed: Linna Darner, Scribe. 01/25/2017. 8:12 PM.  History   Chief Complaint Chief Complaint  Patient presents with  . Rash    The history is provided by the patient. No language interpreter was used.     HPI Comments: Tara May is a 41 y.o. female who presents to the Emergency Department complaining of a gradual onset, constant, gradually worsening, rash to her chest beginning about 3 weeks ago. She describes the rash as burning and pruritic and notes it is spreading to her neck. Pt reports she noticed the rash was spreading to her legs today at work. Pt notes her itching is worse at night. She was treated at the [facility] ED on 2/7 for the same and was diagnosed with contact dermatitis; she was prescribed Keflex and Bactroban cream with no improvement of her rash. She notes she has been keeping the area moisturized. No new soaps, lotions, or detergents. No recent foreign travel or unusual outdoor exposure. Pt denies SOB, trouble swallowing, or any other associated symptoms.  Past Medical History:  Diagnosis Date  . Hep C w/o coma, chronic (HCC)     There are no active problems to display for this patient.   Past Surgical History:  Procedure Laterality Date  . TUBAL LIGATION      OB History    No data available       Home Medications    Prior to Admission medications   Medication Sig Start Date End Date Taking? Authorizing Provider  benzonatate (TESSALON) 100 MG capsule Take 1 capsule (100 mg total) by mouth 3 (three) times daily as needed for cough. 04/03/16   Everlene Farrier, PA-C  cephALEXin (KEFLEX) 500 MG capsule Take 1 capsule (500 mg total) by mouth 4 (four) times daily. 01/22/17   Samantha Tripp  Dowless, PA-C  cetirizine (ZYRTEC ALLERGY) 10 MG tablet Take 1 tablet (10 mg total) by mouth daily. 04/03/16   Everlene Farrier, PA-C  ibuprofen (ADVIL,MOTRIN) 200 MG tablet Take 400 mg by mouth every 6 (six) hours as needed for moderate pain.    Historical Provider, MD  ibuprofen (ADVIL,MOTRIN) 800 MG tablet Take 1 tablet (800 mg total) by mouth 3 (three) times daily. Patient not taking: Reported on 04/03/2016 12/24/15   Arby Barrette, MD  mupirocin nasal ointment (BACTROBAN) 2 % Apply in each nostril daily 01/22/17   Samantha Tripp Dowless, PA-C  ondansetron (ZOFRAN ODT) 4 MG disintegrating tablet Take 1 tablet (4 mg total) by mouth every 8 (eight) hours as needed for nausea or vomiting. 04/03/16   Everlene Farrier, PA-C  orphenadrine (NORFLEX) 100 MG tablet Take 1 tablet (100 mg total) by mouth 2 (two) times daily. Patient not taking: Reported on 04/03/2016 12/24/15   Arby Barrette, MD  triamcinolone cream (KENALOG) 0.1 % Apply 1 application topically 2 (two) times daily. 01/22/17   Samantha Tripp Dowless, PA-C    Family History No family history on file.  Social History Social History  Substance Use Topics  . Smoking status: Never Smoker  . Smokeless tobacco: Never Used  . Alcohol use No     Allergies   Patient has no known allergies.   Review of Systems Review of Systems  HENT: Negative for trouble swallowing.   Respiratory:  Negative for shortness of breath.   Skin: Positive for rash.  All other systems reviewed and are negative.   Physical Exam Updated Vital Signs BP (!) 135/102 (BP Location: Left Arm)   Pulse 82   Temp 98.3 F (36.8 C) (Oral)   Resp 18   Ht 5' (1.524 m)   Wt 162 lb (73.5 kg)   SpO2 100%   BMI 31.64 kg/m   Physical Exam  Constitutional: She is oriented to person, place, and time. She appears well-developed and well-nourished. No distress.  HENT:  Head: Normocephalic and atraumatic.  Eyes: Conjunctivae and EOM are normal.  Neck: Neck supple. No tracheal  deviation present.  Cardiovascular: Normal rate.   Pulmonary/Chest: Effort normal. No respiratory distress.  Musculoskeletal: Normal range of motion.  Neurological: She is alert and oriented to person, place, and time.  Skin: Skin is warm and dry. Rash noted.  Multiple erythematous singular and confluent papules involving the upper chest and neck and the medial left lower leg. No signs of infection. There are multiple areas of excoriation.  Psychiatric: She has a normal mood and affect. Her behavior is normal.  Nursing note and vitals reviewed.    ED Treatments / Results  Labs (all labs ordered are listed, but only abnormal results are displayed) Labs Reviewed - No data to display  EKG  EKG Interpretation None       Radiology No results found.  Procedures Procedures (including critical care time)  DIAGNOSTIC STUDIES: Oxygen Saturation is 100% on RA, normal by my interpretation.    COORDINATION OF CARE: 8:20 PM Discussed treatment plan with pt at bedside and pt agreed to plan.  Medications Ordered in ED Medications - No data to display   Initial Impression / Assessment and Plan / ED Course  I have reviewed the triage vital signs and the nursing notes.  Pertinent labs & imaging results that were available during my care of the patient were reviewed by me and considered in my medical decision making (see chart for details).      patient with rash on her chest. Will cover for scabies. Treat with oral prednisone and permethrin. Will switch to TAC ointment.  f/u with  Dermatology.  Final Clinical Impressions(s) / ED Diagnoses   Final diagnoses:  Rash and nonspecific skin eruption    New Prescriptions Discharge Medication List as of 01/25/2017  8:26 PM    START taking these medications   Details  permethrin (ELIMITE) 5 % cream Apply to entire body other than face - let sit for 14 hours then wash off, may repeat in 1 week if still having symptoms, Print      predniSONE (DELTASONE) 20 MG tablet 3 tabs po daily x 3 days, then 2 tabs x 3 days, then 1.5 tabs x 3 days, then 1 tab x 3 days, then 0.5 tabs x 3 days, Print    triamcinolone (KENALOG) 0.025 % ointment Apply 1 application topically 2 (two) times daily., Starting Sat 01/25/2017, Print       I personally performed the services described in this documentation, which was scribed in my presence. The recorded information has been reviewed and is accurate.      Arthor CaptainAbigail Kewanna Kasprzak, PA-C 01/28/17 1633    Rolan BuccoMelanie Belfi, MD 02/03/17 [phone]

## 2017-04-27 ENCOUNTER — Emergency Department (HOSPITAL_COMMUNITY)
Admission: EM | Admit: 2017-04-27 | Discharge: 2017-04-27 | Disposition: A | Discharge status: Home / Self Care | Payer: Self-pay | Attending: Emergency Medicine | Admitting: Emergency Medicine

## 2017-04-27 ENCOUNTER — Encounter (HOSPITAL_COMMUNITY): Payer: Self-pay | Admitting: Emergency Medicine

## 2017-04-27 DIAGNOSIS — R350 Frequency of micturition: Secondary | ICD-10-CM | POA: Insufficient documentation

## 2017-04-27 DIAGNOSIS — R3915 Urgency of urination: Secondary | ICD-10-CM

## 2017-04-27 DIAGNOSIS — Z79899 Other long term (current) drug therapy: Secondary | ICD-10-CM | POA: Insufficient documentation

## 2017-04-27 DIAGNOSIS — R319 Hematuria, unspecified: Secondary | ICD-10-CM | POA: Insufficient documentation

## 2017-04-27 LAB — URINALYSIS, ROUTINE W REFLEX MICROSCOPIC
BILIRUBIN URINE: NEGATIVE
Glucose, UA: NEGATIVE mg/dL
KETONES UR: NEGATIVE mg/dL
Nitrite: NEGATIVE
PH: 5 (ref 5.0–8.0)
PROTEIN: 30 mg/dL — AB
Specific Gravity, Urine: 1.024 (ref 1.005–1.030)

## 2017-04-27 LAB — I-STAT CHEM 8, ED
BUN: 11 mg/dL (ref 6–20)
Calcium, Ion: 1.13 mmol/L — ABNORMAL LOW (ref 1.15–1.40)
Chloride: 104 mmol/L (ref 101–111)
Creatinine, Ser: 0.7 mg/dL (ref 0.44–1.00)
Glucose, Bld: 84 mg/dL (ref 65–99)
HCT: 38 % (ref 36.0–46.0)
HEMOGLOBIN: 12.9 g/dL (ref 12.0–15.0)
Potassium: 3.5 mmol/L (ref 3.5–5.1)
SODIUM: 141 mmol/L (ref 135–145)
TCO2: 26 mmol/L (ref 0–100)

## 2017-04-27 LAB — POC URINE PREG, ED: PREG TEST UR: NEGATIVE

## 2017-04-27 MED ORDER — KETOROLAC TROMETHAMINE 60 MG/2ML IM SOLN
15.0000 mg | Freq: Once | INTRAMUSCULAR | Status: AC
  Administered 2017-04-27: 15 mg via INTRAMUSCULAR
  Filled 2017-04-27: qty 2

## 2017-04-27 MED ORDER — PHENAZOPYRIDINE HCL 200 MG PO TABS: 200.0000 mg | ORAL_TABLET | Freq: Three times a day (TID) | ORAL | 0 refills | Status: DC | PRN

## 2017-04-27 MED ORDER — STERILE WATER FOR INJECTION IJ SOLN
INTRAMUSCULAR | Status: AC
  Administered 2017-04-27: 10 mL
  Filled 2017-04-27: qty 10

## 2017-04-27 MED ORDER — CEPHALEXIN 500 MG PO CAPS: 500.0000 mg | ORAL_CAPSULE | Freq: Two times a day (BID) | ORAL | 0 refills | Status: DC

## 2017-04-27 MED ORDER — CEFTRIAXONE SODIUM 1 G IJ SOLR
1.0000 g | Freq: Once | INTRAMUSCULAR | Status: AC
  Administered 2017-04-27: 1 g via INTRAMUSCULAR
  Filled 2017-04-27: qty 10

## 2017-04-27 NOTE — Discharge Instructions (Signed)
Your urine shows signs of infection. Please take the antibiotic as prescribed. May take the Pyridium as needed for bladder spasms this location should not be taken for more than 2 days while taking the antibiotic. Warm soaks in the bath. Motrin and Tylenol as needed. Follow-up with your primary care doctor. Return if he develops any fevers, vomiting, worsening pain, vaginal discharge.

## 2017-04-27 NOTE — ED Triage Notes (Signed)
Pt from home with complaints of hematuria and pressure with urination since Friday. Pt states she has also had frequent urination. Pt states that when she went to the bathroom last, she had a twinge in her left groin area, but otherwise denies pain.

## 2017-04-27 NOTE — ED Provider Notes (Signed)
[facility]-EMERGENCY DEPT Provider Note   CSN: [id number] Arrival date & time: 04/27/17  1620     History   Chief Complaint Chief Complaint  Patient presents with  . Hematuria    HPI Tara May is a 41 y.o. female.  HPI 41 year old female presents to the emergency Department today with complaints of hematuria, urinary urgency, urinary frequency. The patient states her symptoms started approximately 3 days ago. Denies any history of UTIs. States that she has urinary urgency and frequency and noticed blood in her urine yesterday. She denies any pelvic pain, flank pain, vaginal bleeding, vaginal discharge. Patient states that she is sexually active and is not concerned for STDs. Denies any pain. Patient denies any fever, chills, abdominal pain, nausea, emesis. She has tried no over-the-counter medications for her symptoms. Past Medical History:  Diagnosis Date  . Hep C w/o coma, chronic (HCC)     There are no active problems to display for this patient.   Past Surgical History:  Procedure Laterality Date  . TUBAL LIGATION      OB History    No data available       Home Medications    Prior to Admission medications   Medication Sig Start Date End Date Taking? Authorizing Provider  ibuprofen (ADVIL,MOTRIN) 200 MG tablet Take 400 mg by mouth every 6 (six) hours as needed for moderate pain.   Yes [provider]  cephALEXin (KEFLEX) 500 MG capsule Take 1 capsule (500 mg total) by mouth 4 (four) times daily. Patient not taking: Reported on 04/27/2017 01/22/17   Dowless, Lester Kinsman, PA-C  mupirocin nasal ointment (BACTROBAN) 2 % Apply in each nostril daily Patient not taking: Reported on 04/27/2017 01/22/17   Dowless, Lelon Mast Tripp, PA-C  orphenadrine (NORFLEX) 100 MG tablet Take 1 tablet (100 mg total) by mouth 2 (two) times daily. Patient not taking: Reported on 04/03/2016 12/24/15   Arby Barrette, MD  predniSONE (DELTASONE) 20 MG tablet 3 tabs po daily x 3 days,  then 2 tabs x 3 days, then 1.5 tabs x 3 days, then 1 tab x 3 days, then 0.5 tabs x 3 days Patient not taking: Reported on 04/27/2017 01/25/17   Arthor Captain, PA-C  triamcinolone (KENALOG) 0.025 % ointment Apply 1 application topically 2 (two) times daily. Patient not taking: Reported on 04/27/2017 01/25/17   Arthor Captain, PA-C    Family History No family history on file.  Social History Social History  Substance Use Topics  . Smoking status: Never Smoker  . Smokeless tobacco: Never Used  . Alcohol use No     Allergies   Patient has no known allergies.   Review of Systems Review of Systems  Constitutional: Negative for chills and fever.  Genitourinary: Positive for dysuria, frequency, hematuria and urgency. Negative for flank pain, pelvic pain, vaginal bleeding and vaginal discharge.     Physical Exam Updated Vital Signs BP (!) 149/88 (BP Location: Left Arm)   Pulse 76   Temp 98.2 F (36.8 C) (Oral)   Resp 18   LMP 04/13/2017   SpO2 100%   Physical Exam  Constitutional: She appears well-developed and well-nourished. No distress.  Non toxic appearing.  HENT:  Head: Normocephalic and atraumatic.  Mouth/Throat: Oropharynx is clear and moist.  Eyes: Conjunctivae are normal. Right eye exhibits no discharge. Left eye exhibits no discharge. No scleral icterus.  Neck: Normal range of motion. Neck supple. No thyromegaly present.  Cardiovascular: Normal rate, regular rhythm, normal heart sounds and intact  distal pulses.   Pulmonary/Chest: Effort normal and breath sounds normal.  Abdominal: Soft. Bowel sounds are normal. She exhibits no distension. There is no tenderness. There is no rigidity, no rebound, no guarding and no CVA tenderness.  Musculoskeletal: Normal range of motion.  Lymphadenopathy:    She has no cervical adenopathy.  Neurological: She is alert.  Skin: Skin is warm and dry. Capillary refill takes less than 2 seconds.  Nursing note and vitals  reviewed.    ED Treatments / Results  Labs (all labs ordered are listed, but only abnormal results are displayed) Labs Reviewed  URINALYSIS, ROUTINE W REFLEX MICROSCOPIC - Abnormal; Notable for the following:       Result Value   APPearance HAZY (*)    Hgb urine dipstick LARGE (*)    Protein, ur 30 (*)    Leukocytes, UA MODERATE (*)    Bacteria, UA RARE (*)    Squamous Epithelial / LPF 0-5 (*)    All other components within normal limits  I-STAT CHEM 8, ED - Abnormal; Notable for the following:    Calcium, Ion 1.13 (*)    All other components within normal limits  POC URINE PREG, ED    EKG  EKG Interpretation None       Radiology No results found.  Procedures Procedures (including critical care time)  Medications Ordered in ED Medications  cefTRIAXone (ROCEPHIN) injection 1 g (1 g Intramuscular Given 04/27/17 2003)  sterile water (preservative free) injection (10 mLs  Given 04/27/17 2003)  ketorolac (TORADOL) injection 15 mg (15 mg Intramuscular Given 04/27/17 2003)     Initial Impression / Assessment and Plan / ED Course  I have reviewed the triage vital signs and the nursing notes.  Pertinent labs & imaging results that were available during my care of the patient were reviewed by me and considered in my medical decision making (see chart for details).     Pt has been diagnosed with a UTI. Pt is afebrile, no CVA tenderness, normotensive, and denies N/V. Creatine normal. Urine cultured. Pt to be dc home with antibiotics and instructions to follow up with PCP if symptoms persist.   Final Clinical Impressions(s) / ED Diagnoses   Final diagnoses:  Hematuria, unspecified type  Urinary urgency  Urinary frequency    New Prescriptions Discharge Medication List as of 04/27/2017  8:22 PM    START taking these medications   Details  phenazopyridine (PYRIDIUM) 200 MG tablet Take 1 tablet (200 mg total) by mouth 3 (three) times daily as needed for pain. Do not take  for more than 2 days with antibiotic., Starting Sun 04/27/2017, Print         Rise MuLeaphart, Amanda Steuart T, PA-C 04/28/17 1546    Rolland PorterJames, Mark, MD 05/02/17 [phone]

## 2017-06-24 ENCOUNTER — Emergency Department (HOSPITAL_COMMUNITY)
Admission: EM | Admit: 2017-06-24 | Discharge: 2017-06-24 | Disposition: A | Discharge status: Home / Self Care | Payer: Self-pay | Attending: Emergency Medicine | Admitting: Emergency Medicine

## 2017-06-24 ENCOUNTER — Encounter (HOSPITAL_COMMUNITY): Payer: Self-pay

## 2017-06-24 ENCOUNTER — Emergency Department (HOSPITAL_COMMUNITY): Payer: Self-pay

## 2017-06-24 DIAGNOSIS — Y929 Unspecified place or not applicable: Secondary | ICD-10-CM | POA: Insufficient documentation

## 2017-06-24 DIAGNOSIS — R0789 Other chest pain: Secondary | ICD-10-CM | POA: Insufficient documentation

## 2017-06-24 DIAGNOSIS — W1839XA Other fall on same level, initial encounter: Secondary | ICD-10-CM | POA: Insufficient documentation

## 2017-06-24 DIAGNOSIS — Y999 Unspecified external cause status: Secondary | ICD-10-CM | POA: Insufficient documentation

## 2017-06-24 DIAGNOSIS — R0781 Pleurodynia: Secondary | ICD-10-CM

## 2017-06-24 DIAGNOSIS — Y939 Activity, unspecified: Secondary | ICD-10-CM | POA: Insufficient documentation

## 2017-06-24 DIAGNOSIS — S5001XA Contusion of right elbow, initial encounter: Secondary | ICD-10-CM | POA: Insufficient documentation
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
## 2017-06-24 NOTE — Discharge Instructions (Signed)
Read the information below.  You may return to the Emergency Department at any time for worsening condition or any new symptoms that concern you.   If you develop worsening chest pain, shortness of breath, fever, you pass out, or become weak or dizzy, return to the ER for a recheck.    °
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
## 2017-11-18 ENCOUNTER — Emergency Department (HOSPITAL_COMMUNITY)
Admission: EM | Admit: 2017-11-18 | Discharge: 2017-11-18 | Disposition: A | Discharge status: Home / Self Care | Payer: Managed Care, Other (non HMO) | Attending: Emergency Medicine | Admitting: Emergency Medicine

## 2017-11-18 ENCOUNTER — Encounter (HOSPITAL_COMMUNITY): Payer: Self-pay | Admitting: Emergency Medicine

## 2017-11-18 ENCOUNTER — Other Ambulatory Visit: Payer: Self-pay

## 2017-11-18 DIAGNOSIS — R05 Cough: Secondary | ICD-10-CM | POA: Diagnosis not present

## 2017-11-18 DIAGNOSIS — R0981 Nasal congestion: Secondary | ICD-10-CM | POA: Insufficient documentation

## 2017-11-18 DIAGNOSIS — H9201 Otalgia, right ear: Secondary | ICD-10-CM | POA: Diagnosis present

## 2017-11-18 DIAGNOSIS — H60311 Diffuse otitis externa, right ear: Secondary | ICD-10-CM | POA: Insufficient documentation

## 2017-11-18 DIAGNOSIS — Z79899 Other long term (current) drug therapy: Secondary | ICD-10-CM | POA: Diagnosis not present

## 2017-11-18 DIAGNOSIS — J069 Acute upper respiratory infection, unspecified: Secondary | ICD-10-CM | POA: Diagnosis not present

## 2017-11-18 MED ORDER — AMOXICILLIN 500 MG PO CAPS: 500.0000 mg | ORAL_CAPSULE | Freq: Three times a day (TID) | ORAL | 0 refills | Status: DC

## 2017-11-18 MED ORDER — NEOMYCIN-POLYMYXIN-HC 3.5-10000-1 OT SUSP: 4.0000 [drp] | Freq: Three times a day (TID) | OTIC | 0 refills | Status: DC

## 2017-11-18 NOTE — ED Provider Notes (Signed)
[facility]-EMERGENCY DEPT Provider Note   CSN: [id number] Arrival date & time: 11/18/17  1000     History   Chief Complaint Chief Complaint  Patient presents with  . Otalgia    HPI Tara May is a 41 y.o. female.  The history is provided by the patient. No language interpreter was used.  Otalgia  This is a new problem. The current episode started more than 2 days ago. There is pain in the right ear. The problem occurs constantly. There has been no fever. The pain is moderate. Associated symptoms include ear discharge. Her past medical history is significant for hearing loss.   Pt complains of drainage from right ear.  Pt reports cough and congestion.  Past Medical History:  Diagnosis Date  . Hep C w/o coma, chronic (HCC)     There are no active problems to display for this patient.   Past Surgical History:  Procedure Laterality Date  . TUBAL LIGATION      OB History    No data available       Home Medications    Prior to Admission medications   Medication Sig Start Date End Date Taking? Authorizing Provider  cephALEXin (KEFLEX) 500 MG capsule Take 1 capsule (500 mg total) by mouth 2 (two) times daily. 04/27/17   Rise MuLeaphart, Kenneth T, PA-C  ibuprofen (ADVIL,MOTRIN) 200 MG tablet Take 400 mg by mouth every 6 (six) hours as needed for moderate pain.    [provider]  mupirocin nasal ointment (BACTROBAN) 2 % Apply in each nostril daily Patient not taking: Reported on 04/27/2017 01/22/17   Dowless, Lelon MastSamantha Tripp, PA-C  orphenadrine (NORFLEX) 100 MG tablet Take 1 tablet (100 mg total) by mouth 2 (two) times daily. Patient not taking: Reported on 04/03/2016 12/24/15   Arby BarrettePfeiffer, Marcy, MD  phenazopyridine (PYRIDIUM) 200 MG tablet Take 1 tablet (200 mg total) by mouth 3 (three) times daily as needed for pain. Do not take for more than 2 days with antibiotic. 04/27/17   Rise MuLeaphart, Kenneth T, PA-C  predniSONE (DELTASONE) 20 MG tablet 3 tabs po  daily x 3 days, then 2 tabs x 3 days, then 1.5 tabs x 3 days, then 1 tab x 3 days, then 0.5 tabs x 3 days Patient not taking: Reported on 04/27/2017 01/25/17   Arthor CaptainHarris, Abigail, PA-C  triamcinolone (KENALOG) 0.025 % ointment Apply 1 application topically 2 (two) times daily. Patient not taking: Reported on 04/27/2017 01/25/17   Arthor CaptainHarris, Abigail, PA-C    Family History Family History  Adopted: Yes    Social History Social History   Tobacco Use  . Smoking status: Never Smoker  . Smokeless tobacco: Never Used  Substance Use Topics  . Alcohol use: No  . Drug use: No     Allergies   Patient has no known allergies.   Review of Systems Review of Systems  HENT: Positive for ear discharge and ear pain.   All other systems reviewed and are negative.    Physical Exam Updated Vital Signs BP 126/78 (BP Location: Left Arm)   Pulse 71   Temp 98.6 F (37 C) (Oral)   Resp 18   Ht 5' (1.524 m)   Wt 77.1 kg (170 lb)   LMP 11/04/2017   SpO2 99%   BMI 33.20 kg/m   Physical Exam  Constitutional: She is oriented to person, place, and time. She appears well-developed and well-nourished.  HENT:  Head: Normocephalic.  Left Ear: External ear normal.  Nose: Nose normal.  Mouth/Throat: Oropharynx is clear and moist.  Right canal swollen, drainage,  I am unable to see tm.  Eyes: EOM are normal. Pupils are equal, round, and reactive to light.  Neck: Normal range of motion.  Cardiovascular: Normal rate.  Pulmonary/Chest: Effort normal.  Abdominal: She exhibits no distension.  Musculoskeletal: Normal range of motion.  Neurological: She is alert and oriented to person, place, and time.  Skin: Skin is warm.  Psychiatric: She has a normal mood and affect.  Nursing note and vitals reviewed.    ED Treatments / Results  Labs (all labs ordered are listed, but only abnormal results are displayed) Labs Reviewed - No data to display  EKG  EKG Interpretation None       Radiology No  results found.  Procedures Procedures (including critical care time)  Medications Ordered in ED Medications - No data to display   Initial Impression / Assessment and Plan / ED Course  I have reviewed the triage vital signs and the nursing notes.  Pertinent labs & imaging results that were available during my care of the patient were reviewed by me and considered in my medical decision making (see chart for details).     Pt counseled on external otitis.   Final Clinical Impressions(s) / ED Diagnoses   Final diagnoses:  Acute diffuse otitis externa of right ear  Viral upper respiratory tract infection    ED Discharge Orders    None     Meds ordered this encounter  Medications  . amoxicillin (AMOXIL) 500 MG capsule    Sig: Take 1 capsule (500 mg total) by mouth 3 (three) times daily.    Dispense:  21 capsule    Refill:  0    Order Specific Question:   Supervising Provider    Answer:   MILLER, BRIAN [[id number]]  . neomycin-polymyxin-hydrocortisone (CORTISPORIN) 3.5-10000-1 OTIC suspension    Sig: Place 4 drops into the right ear 3 (three) times daily.    Dispense:  10 mL    Refill:  0    Order Specific Question:   Supervising Provider    Answer:   Eber HongMILLER, BRIAN [[id number]]   An After Visit Summary was printed and given to the patient.    Elson AreasSofia, Leslie K, [place] JerseyPA-C 11/18/17 1153    Lorre NickAllen, Anthony, MD 11/21/17 [phone]

## 2017-11-18 NOTE — ED Triage Notes (Signed)
R ear pain and congestion x 4 days.

## 2017-11-21 ENCOUNTER — Encounter (HOSPITAL_COMMUNITY): Payer: Self-pay | Admitting: Emergency Medicine

## 2017-11-21 ENCOUNTER — Emergency Department (HOSPITAL_COMMUNITY)
Admission: EM | Admit: 2017-11-21 | Discharge: 2017-11-21 | Disposition: A | Discharge status: Home / Self Care | Payer: Managed Care, Other (non HMO) | Attending: Emergency Medicine | Admitting: Emergency Medicine

## 2017-11-21 ENCOUNTER — Other Ambulatory Visit: Payer: Self-pay

## 2017-11-21 DIAGNOSIS — H9201 Otalgia, right ear: Secondary | ICD-10-CM | POA: Diagnosis present

## 2017-11-21 DIAGNOSIS — H6500 Acute serous otitis media, unspecified ear: Secondary | ICD-10-CM

## 2017-11-21 DIAGNOSIS — H6501 Acute serous otitis media, right ear: Secondary | ICD-10-CM | POA: Diagnosis not present

## 2017-11-21 NOTE — ED Notes (Signed)
Patient refused discharge vital signs. 

## 2017-11-21 NOTE — Discharge Instructions (Signed)
You Can take Mucinex as directed in addition to the antibiotic prescribed and eardrops which will help to dry up secretions.  Call Dr. Annalee GentaShoemaker to arrange to be seen in the office if not feeling better in 4 or 5 days.

## 2017-11-21 NOTE — ED Triage Notes (Signed)
Pt verbalizes continued right ear pain "it feels like fluid is in it" post being seen Tuesday.

## 2017-11-21 NOTE — ED Notes (Signed)
Bed: WTR6 Expected date:  Expected time:  Means of arrival:  Comments: 

## 2017-11-21 NOTE — ED Notes (Signed)
Patient refused to sign discharge paperwork and grabbed discharge papers from writer's hand and stated, "I just want to go." Patient left quietly.

## 2017-11-21 NOTE — ED Provider Notes (Signed)
[facility]-EMERGENCY DEPT Provider Note   CSN: [id number] Arrival date & time: 11/21/17  0600     History   Chief Complaint Chief Complaint  Patient presents with  . Otalgia    HPI Tara May is a 41 y.o. female.  HPI Patient has feeling of fluid behind her right ear for approximately the past week.  She was seen here 3 days ago for the same complaint, scribed amoxicillin and Cortisporin eardrops which she is taken without relief.  She has somewhat diminished hearing in her right ear feels like she is listening through water.  No fever no cough no other associated symptoms. Past Medical History:  Diagnosis Date  . Hep C w/o coma, chronic (HCC)     There are no active problems to display for this patient.   Past Surgical History:  Procedure Laterality Date  . TUBAL LIGATION      OB History    No data available       Home Medications    Prior to Admission medications   Medication Sig Start Date End Date Taking? Authorizing Provider  amoxicillin (AMOXIL) 500 MG capsule Take 1 capsule (500 mg total) by mouth 3 (three) times daily. 11/18/17  Yes Cheron SchaumannSofia, Leslie K, PA-C  ibuprofen (ADVIL,MOTRIN) 200 MG tablet Take 400 mg by mouth every 6 (six) hours as needed for moderate pain.   Yes [provider]  neomycin-polymyxin-hydrocortisone (CORTISPORIN) 3.5-10000-1 OTIC suspension Place 4 drops into the right ear 3 (three) times daily. 11/18/17  Yes Elson AreasSofia, Leslie K, PA-C    Family History Family History  Adopted: Yes    Social History Social History   Tobacco Use  . Smoking status: Never Smoker  . Smokeless tobacco: Never Used  Substance Use Topics  . Alcohol use: No  . Drug use: No     Allergies   Patient has no known allergies.   Review of Systems Review of Systems  Constitutional: Negative.   HENT: Positive for congestion and ear pain.        Pressure sensation in the ear  Respiratory: Negative.      Physical  Exam Updated Vital Signs BP 123/64 (BP Location: Left Arm)   Pulse 73   Temp 97.8 F (36.6 C) (Oral)   Resp 16   LMP 11/04/2017   SpO2 94%   Physical Exam  Constitutional: She appears well-developed and well-nourished.  HENT:  Head: Normocephalic and atraumatic.  Right Ear: External ear normal.  Left Ear: External ear normal.  Mouth/Throat: Oropharynx is clear and moist.  Right ear canal normal.  No pain on retraction of pinna or tragus.  Tympanic membrane normal however there is fluid behind the tympanic membrane which appears clear.  Cardiovascular: Normal rate.  Pulmonary/Chest: Effort normal.  Abdominal: She exhibits no distension.  Skin: Skin is dry. No rash noted.  Nursing note and vitals reviewed.    ED Treatments / Results  Labs (all labs ordered are listed, but only abnormal results are displayed) Labs Reviewed - No data to display  EKG  EKG Interpretation None       Radiology No results found.  Procedures Procedures (including critical care time)  Medications Ordered in ED Medications - No data to display   Initial Impression / Assessment and Plan / ED Course  I have reviewed the triage vital signs and the nursing notes.  Pertinent labs & imaging results that were available during my care of the patient were reviewed by me  and considered in my medical decision making (see chart for details).     Family history consistent with serous otitis media.  Plan continue present medications.  Referral Dr. Annalee GentaShoemaker [facility]  Final Clinical Impressions(s) / ED Diagnoses  Diagnosis serous otitis media-right-sided Final diagnoses:  Acute serous otitis media, recurrence not specified, unspecified laterality    ED Discharge Orders    None       Doug SouJacubowitz, Oluwatoni Rotunno, MD 11/21/17 1002

## 2017-12-18 DIAGNOSIS — H60501 Unspecified acute noninfective otitis externa, right ear: Secondary | ICD-10-CM | POA: Insufficient documentation

## 2017-12-18 DIAGNOSIS — H6993 Unspecified Eustachian tube disorder, bilateral: Secondary | ICD-10-CM | POA: Insufficient documentation

## 2017-12-18 DIAGNOSIS — H6983 Other specified disorders of Eustachian tube, bilateral: Secondary | ICD-10-CM | POA: Insufficient documentation

## 2017-12-26 ENCOUNTER — Ambulatory Visit (INDEPENDENT_AMBULATORY_CARE_PROVIDER_SITE_OTHER): Payer: Managed Care, Other (non HMO)

## 2017-12-26 ENCOUNTER — Ambulatory Visit (HOSPITAL_COMMUNITY)
Admission: EM | Admit: 2017-12-26 | Discharge: 2017-12-26 | Disposition: A | Discharge status: Home / Self Care | Payer: Managed Care, Other (non HMO) | Attending: Internal Medicine | Admitting: Internal Medicine

## 2017-12-26 ENCOUNTER — Encounter (HOSPITAL_COMMUNITY): Payer: Self-pay | Admitting: Family Medicine

## 2017-12-26 ENCOUNTER — Encounter: Payer: Self-pay | Admitting: Podiatry

## 2017-12-26 ENCOUNTER — Ambulatory Visit (INDEPENDENT_AMBULATORY_CARE_PROVIDER_SITE_OTHER): Payer: Managed Care, Other (non HMO) | Admitting: Podiatry

## 2017-12-26 VITALS — BP 139/89 | HR 74 | Ht 60.0 in | Wt 167.0 lb

## 2017-12-26 DIAGNOSIS — Z9889 Other specified postprocedural states: Secondary | ICD-10-CM | POA: Diagnosis not present

## 2017-12-26 DIAGNOSIS — M79672 Pain in left foot: Secondary | ICD-10-CM | POA: Diagnosis not present

## 2017-12-26 DIAGNOSIS — M795 Residual foreign body in soft tissue: Secondary | ICD-10-CM

## 2017-12-26 DIAGNOSIS — R269 Unspecified abnormalities of gait and mobility: Secondary | ICD-10-CM | POA: Diagnosis not present

## 2017-12-26 MED ORDER — MELOXICAM 7.5 MG PO TABS: 7.5000 mg | ORAL_TABLET | Freq: Every day | ORAL | 0 refills | Status: DC

## 2017-12-26 MED ORDER — AMOXICILLIN-POT CLAVULANATE 875-125 MG PO TABS: 1.0000 | ORAL_TABLET | Freq: Two times a day (BID) | ORAL | 0 refills | Status: DC

## 2017-12-26 NOTE — Discharge Instructions (Signed)
No appointments today. Please take Augmentin as directed. Mobic for pain. Please follow up with general surgery for further evaluation and treatment needed.

## 2017-12-26 NOTE — Progress Notes (Signed)
   Subjective:    Patient ID: Tara May, female    DOB: October 03, 1976, 42 y.o.   MRN: [id number]  HPI  Chief Complaint  Patient presents with  . Foot Injury    stepped on a toothpick - [facility] - most of the toothpick is out, but they want to be sure   42 y.o. female presents with the above complaint.  States that she stepped on a toothpick yesterday and believe that most of the toothpick came out however she thinks that there is still something in 1 to urgent care x-rays were taken she was given antibiotic but they did not attempt removal.  Reports pain and difficulty ambulating.  Past Medical History:  Diagnosis Date  . Hep C w/o coma, chronic (HCC)    Past Surgical History:  Procedure Laterality Date  . TUBAL LIGATION      Current Outpatient Medications:  .  amoxicillin-clavulanate (AUGMENTIN) 875-125 MG tablet, Take 1 tablet by mouth every 12 (twelve) hours., Disp: 14 tablet, Rfl: 0 .  ibuprofen (ADVIL,MOTRIN) 200 MG tablet, Take 400 mg by mouth every 6 (six) hours as needed for moderate pain., Disp: , Rfl:  .  meloxicam (MOBIC) 7.5 MG tablet, Take 1 tablet (7.5 mg total) by mouth daily., Disp: 15 tablet, Rfl: 0  No Known Allergies  Review of Systems  All other systems reviewed and are negative.      Objective:   Physical Exam Vitals:   12/26/17 1441  BP: 139/89  Pulse: 74   General AA&O x3. Normal mood and affect.  Vascular Dorsalis pedis and posterior tibial pulses  present 2+ bilaterally  Capillary refill normal to all digits. Pedal hair growth normal.  Neurologic Epicritic sensation grossly present.  Dermatologic No open lesions. Interspaces clear of maceration. Nails well groomed and normal in appearance. Puncture site noted to the plantar left heel without any signs of infection  Orthopedic: MMT 5/5 in dorsiflexion, plantarflexion, inversion, and eversion. Normal joint ROM without pain or crepitus. Pain to palpation L heel.      Assessment &  Plan:  Patient was evaluated and treated all questions answered  Foreign Body, L Foot -X-rays reviewed from urgent care. -Discussed with patient in office removal of foreign body.  Patient elected to proceed. -Foreign body successfully removed as below.  -Patient to continue antibiotics as prescribed at urgent care. -Walking boot dispensed  Procedure: Removal Foreign Body Anesthesia: Lidocaine 1% with Epinephrine 3 mls Instrumentation: 15 blade, hemostat Technique: After local infiltration of the above anesthetic, the foot was prepped in the usual sterile fashion attention was then directed to the plantar aspect of the foot where the puncture site was noted.  The puncture site was extended with a 15 blade. Blunt dissection was performed with a hemostat.  The area was then explored and a small wooden object with the appearance of a toothpick approximately quarter inch size was removed from the wound.  Area was cleansed and sutured with 3-0 nylon.  Sterile dressing was applied Dressing: Dry, sterile, compression dressing. Disposition: Patient tolerated procedure well. Patient to return in 1 week for follow-up.

## 2017-12-26 NOTE — ED Triage Notes (Signed)
Pt here for possible piece of toothpick in left heel. Reports that she stepped on it last night. Throbbing and painful to walk and touch.

## 2017-12-26 NOTE — ED Provider Notes (Signed)
[facility]    CSN: [id number] Arrival date & time: 12/26/17  1126     History   Chief Complaint Chief Complaint  Patient presents with  . Foreign Body    HPI Tara May is a 42 y.o. female.   42 year old female comes in for possible foreign body in the left heel.  Patient states she stepped into a toothpick yesterday, was able to remove part of it, but thinks there may be more in the heel.  She states it is very painful on weightbearing today.  She has not noticed increased swelling, erythema, increased warmth, fever.      Past Medical History:  Diagnosis Date  . Hep C w/o coma, chronic (HCC)     There are no active problems to display for this patient.   Past Surgical History:  Procedure Laterality Date  . TUBAL LIGATION      OB History    No data available       Home Medications    Prior to Admission medications   Medication Sig Start Date End Date Taking? Authorizing Provider  amoxicillin-clavulanate (AUGMENTIN) 875-125 MG tablet Take 1 tablet by mouth every 12 (twelve) hours. 12/26/17   Cathie Hoops, Zinedine Ellner V, PA-C  ibuprofen (ADVIL,MOTRIN) 200 MG tablet Take 400 mg by mouth every 6 (six) hours as needed for moderate pain.    [provider]  meloxicam (MOBIC) 7.5 MG tablet Take 1 tablet (7.5 mg total) by mouth daily. 12/26/17   Belinda Fisher, PA-C    Family History Family History  Adopted: Yes    Social History Social History   Tobacco Use  . Smoking status: Never Smoker  . Smokeless tobacco: Never Used  Substance Use Topics  . Alcohol use: No  . Drug use: No     Allergies   Patient has no known allergies.   Review of Systems Review of Systems  Reason unable to perform ROS: See HPI as above.     Physical Exam Triage Vital Signs ED Triage Vitals  Enc Vitals Group     BP 12/26/17 1155 (!) 143/87     Pulse Rate 12/26/17 1155 68     Resp 12/26/17 1155 18     Temp 12/26/17 1155 98.1 F (36.7 C)     Temp src --      SpO2  12/26/17 1155 100 %     Weight --      Height --      Head Circumference --      Peak Flow --      Pain Score 12/26/17 1156 7     Pain Loc --      Pain Edu? --      Excl. in GC? --    No data found.  Updated Vital Signs BP (!) 143/87   Pulse 68   Temp 98.1 F (36.7 C)   Resp 18   LMP 12/14/2017   SpO2 100%   Physical Exam  Constitutional: She is oriented to person, place, and time. She appears well-developed and well-nourished. No distress.  HENT:  Head: Normocephalic and atraumatic.  Eyes: Conjunctivae are normal. Pupils are equal, round, and reactive to light.  Musculoskeletal:  Puncture site seen.  Without erythema, increased warmth.  Very tender to palpation.  No obvious foreign body felt.  Neurological: She is alert and oriented to person, place, and time.     UC Treatments / Results  Labs (all labs ordered are listed, but only  abnormal results are displayed) Labs Reviewed - No data to display  EKG  EKG Interpretation None       Radiology Dg Foot Complete Left  Result Date: 12/26/2017 CLINICAL DATA:  Stepped on toothpick yesterday with persistent pain, initial encounter EXAM: LEFT FOOT - COMPLETE 3+ VIEW COMPARISON:  None. FINDINGS: No acute fracture or dislocation is noted. There is a vague density identified in the soft tissues of the heel inferiorly approximately 1 cm deep to the skin surface. It is uncertain whether this corresponds to the puncture wound but may represent a small wooden fragment. IMPRESSION: Questionable changes in the heel which may represent a small foreign body. Would is typically radiolucent on x-ray. Correlation with physical exam is recommended. Electronically Signed   By: Alcide Clever M.D.   On: 12/26/2017 12:27    Procedures Foreign Body Removal Date/Time: 12/26/2017 2:23 PM Performed by: Belinda Fisher, PA-C Authorized by: Isa Rankin, MD   Consent:    Consent obtained:  Verbal   Consent given by:  Patient   Risks  discussed:  Infection, pain, bleeding and incomplete removal   Alternatives discussed:  Referral Location:    Location:  Leg   Leg location:  L foot Pre-procedure details:    Imaging:  X-ray   Neurovascular status: intact   Anesthesia (see MAR for exact dosages):    Anesthesia method:  Local infiltration   Local anesthetic:  Lidocaine 2% WITH epi Procedure type:    Procedure complexity:  Simple Procedure details:    Scalpel size:  11   Removal mechanism:  Forceps   Foreign bodies recovered:  None Post-procedure details:    Neurovascular status: intact     Confirmation:  Residual foreign bodies remain   Skin closure:  None   Dressing:  Antibiotic ointment and bulky dressing   Patient tolerance of procedure:  Tolerated well, no immediate complications   (including critical care time)  Medications Ordered in UC Medications - No data to display   Initial Impression / Assessment and Plan / UC Course  I have reviewed the triage vital signs and the nursing notes.  Pertinent labs & imaging results that were available during my care of the patient were reviewed by me and considered in my medical decision making (see chart for details).    Discussed x-ray results with patient, possible foreign body 1 cm deep.  Discussed with patient I am not comfortable with that deep an incision. Patient would like to attempt removal, states understanding that I may have to refer her.   Unable to locate a foreign body.  Will start patient on Augmentin to prevent infection.  Mobic for pain.  Discussed case with general surgery, who recommended referral for orthopedic/podiatry for foreign body removal given foreign body located in the foot.  Information on orthopedics and podiatry given.  Patient to follow-up for further evaluation and management needed.  Patient expresses understanding and agrees to plan.   Final Clinical Impressions(s) / UC Diagnoses   Final diagnoses:  Foreign body (FB) in soft  tissue    ED Discharge Orders        Ordered    amoxicillin-clavulanate (AUGMENTIN) 875-125 MG tablet  Every 12 hours,   Status:  Discontinued     12/26/17 1317    meloxicam (MOBIC) 7.5 MG tablet  Daily,   Status:  Discontinued     12/26/17 1317    amoxicillin-clavulanate (AUGMENTIN) 875-125 MG tablet  Every 12 hours  12/26/17 1336    meloxicam (MOBIC) 7.5 MG tablet  Daily     12/26/17 1336        Belinda FisherYu, Mackson Botz V, [place] JerseyPA-C 12/26/17 1426

## 2017-12-30 ENCOUNTER — Telehealth: Payer: Self-pay | Admitting: *Deleted

## 2017-12-30 NOTE — Telephone Encounter (Signed)
Left message informing pt she needed to remain in the surgical boot until 01/02/2018 appt so as not to stress the suture line.

## 2017-12-30 NOTE — Telephone Encounter (Signed)
Pt states she was here last Friday and Dr. Samuella CotaPrice took part of a toothpick out of her foot, does she still need to remain in the boot until the appt 01/02/2018.

## 2017-12-31 ENCOUNTER — Telehealth: Payer: Self-pay | Admitting: Podiatry

## 2017-12-31 NOTE — Telephone Encounter (Signed)
Yes I called and left a message earlier about a closed toe boot to wear to work. If somebody would please call me back at [phone]. Thank you.

## 2017-12-31 NOTE — Telephone Encounter (Signed)
Left message informing pt I had spoken to Dr. Samuella CotaPrice today and he said if she could comfortably go in to a regular shoe that would be fine.

## 2017-12-31 NOTE — Telephone Encounter (Signed)
I was seen on Friday and had a toothpick removed from the heel of my foot and then placed in a boot after a stitch was put in. I was calling to see if y'all had any closed toed boots I can be put in until Friday because I'm here at work and the supervisors said I needed to be in a closed toed boot in order to be able to work. If you would please give me a call back at [phone] I would appreciate it. Thanks. Bye bye.

## 2018-01-02 ENCOUNTER — Ambulatory Visit (INDEPENDENT_AMBULATORY_CARE_PROVIDER_SITE_OTHER): Payer: Managed Care, Other (non HMO) | Admitting: Podiatry

## 2018-01-02 DIAGNOSIS — M795 Residual foreign body in soft tissue: Secondary | ICD-10-CM

## 2018-01-02 DIAGNOSIS — Z9889 Other specified postprocedural states: Secondary | ICD-10-CM

## 2018-01-02 NOTE — Progress Notes (Signed)
  Subjective:  Patient ID: Tara May, female    DOB: 10/17/1976,  MRN: [id number]  No chief complaint on file.  DOS: 12/26/2017 Procedure: Removal of foreign body left foot  42 y.o. female returns for post-op check. Denies N/V/F/Ch. Pain is controlled with current medications.  States that the heel is doing good.  Is wearing regular shoes now as she did not like the boot and said it was too heavy.  Objective:   General AA&O x3. Normal mood and affect.  Vascular Foot warm and well perfused.  Neurologic Gross sensation intact.  Dermatologic Skin healing well without signs of infection. Skin edges well coapted without signs of infection.  Orthopedic: Tenderness to palpation noted about the surgical site.    Assessment & Plan:  Patient was evaluated and treated and all questions answered.  S/p removal of foreign body left heel -Progressing as expected post-operatively. -Sutures: Left intact for an additional 2 weeks.  Patient to return in 2 weeks for visit with RN to remove suture. -Medications refilled: None -Foot redressed. -Advised it is okay to shower.  No soaking.  Return in about 2 weeks (around 01/16/2018) for Suture removal.

## 2018-01-07 ENCOUNTER — Ambulatory Visit: Payer: Managed Care, Other (non HMO) | Admitting: Podiatry

## 2018-01-16 ENCOUNTER — Ambulatory Visit: Payer: Managed Care, Other (non HMO) | Admitting: Podiatry

## 2018-01-16 DIAGNOSIS — Z9889 Other specified postprocedural states: Secondary | ICD-10-CM

## 2018-01-18 NOTE — Progress Notes (Signed)
  Subjective:  Patient ID: Tara May, female    DOB: 01-Nov-1976,  MRN: [id number]  No chief complaint on file.   DOS: 12/26/17 Procedure: Removal of foreign body left  42 y.o. female returns for post-op check. Here for suture removal.  Denies pain.  Denies any issues.  Objective:   General AA&O x3. Normal mood and affect.  Vascular Foot warm and well perfused.  Neurologic Gross sensation intact.  Dermatologic Skin well-healed with thin scar.  Suture intact  Orthopedic: No tenderness to palpation noted about the surgical site.    Assessment & Plan:  Patient was evaluated and treated and all questions answered.  S/p removal of foreign body left foot -Progressing as expected post-operatively. -Sutures: suture removed. -Medications refilled: none -Foot redressed. Follow-up PRN-  No Follow-up on file.

## 2018-05-29 ENCOUNTER — Emergency Department (HOSPITAL_COMMUNITY): Payer: Managed Care, Other (non HMO)

## 2018-05-29 ENCOUNTER — Encounter (HOSPITAL_COMMUNITY): Payer: Self-pay

## 2018-05-29 ENCOUNTER — Other Ambulatory Visit: Payer: Self-pay

## 2018-05-29 ENCOUNTER — Emergency Department (HOSPITAL_COMMUNITY)
Admission: EM | Admit: 2018-05-29 | Discharge: 2018-05-29 | Disposition: A | Discharge status: Home / Self Care | Payer: Managed Care, Other (non HMO) | Attending: Emergency Medicine | Admitting: Emergency Medicine

## 2018-05-29 DIAGNOSIS — M79672 Pain in left foot: Secondary | ICD-10-CM | POA: Diagnosis not present

## 2018-05-29 DIAGNOSIS — Z79899 Other long term (current) drug therapy: Secondary | ICD-10-CM | POA: Diagnosis not present

## 2018-05-29 MED ORDER — IBUPROFEN 200 MG PO TABS
400.0000 mg | ORAL_TABLET | Freq: Once | ORAL | Status: AC
  Administered 2018-05-29: 400 mg via ORAL
  Filled 2018-05-29: qty 2

## 2018-05-29 NOTE — ED Provider Notes (Signed)
[facility]-EMERGENCY DEPT Provider Note   CSN: [id number] Arrival date & time: 05/29/18  1507     History   Chief Complaint Chief Complaint  Patient presents with  . Foot Pain    HPI Tara May is a 42 y.o. female presenting today for left heel and left arch pain.  Patient states that she has had continued pain in the left foot since January when she had a foreign body, toothpick, removed from her heel.  The toothpick was removed by a podiatrist.  Patient states that pain is sharp, moderate pain that is only present whenever she walks.  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  Patient states that she feels like her left foot has some swelling around the arch and heel.  Patient states that nothing improves her pain.  Patient denies history of fever, redness, new trauma or injury.    Past Medical History:  Diagnosis Date  . Hep C w/o coma, chronic (HCC)     There are no active problems to display for this patient.   Past Surgical History:  Procedure Laterality Date  . TUBAL LIGATION       OB History   None      Home Medications    Prior to Admission medications   Medication Sig Start Date End Date Taking? Authorizing Provider  ibuprofen (ADVIL,MOTRIN) 200 MG tablet Take 400 mg by mouth every 6 (six) hours as needed for moderate pain.   Yes [provider]  amoxicillin-clavulanate (AUGMENTIN) 875-125 MG tablet Take 1 tablet by mouth every 12 (twelve) hours. Patient not taking: Reported on 05/29/2018 12/26/17   Belinda Fisher, PA-C  meloxicam (MOBIC) 7.5 MG tablet Take 1 tablet (7.5 mg total) by mouth daily. 12/26/17   Cathie Hoops, Amy V, PA-C  loratadine (CLARITIN) 10 MG tablet Take 1 tablet (10 mg total) by mouth daily. Patient not taking: Reported on 12/24/2015 06/06/15 01/04/16  Danelle Berry, PA-C  promethazine (PHENERGAN) 25 MG tablet Take 1 tablet (25 mg total) by mouth every 6 (six) hours as needed  for nausea or vomiting. Patient not taking: Reported on 12/24/2015 07/26/14 01/04/16  Jaynie Crumble, PA-C    Family History Family History  Adopted: Yes    Social History Social History   Tobacco Use  . Smoking status: Never Smoker  . Smokeless tobacco: Never Used  Substance Use Topics  . Alcohol use: No  . Drug use: No     Allergies   Patient has no known allergies.   Review of Systems Review of Systems  Constitutional: Negative.  Negative for fever.  HENT: Negative.   Respiratory: Negative.  Negative for cough and shortness of breath.   Cardiovascular: Negative.  Negative for chest pain.  Gastrointestinal: Negative.  Negative for abdominal pain, diarrhea, nausea and vomiting.  Genitourinary: Negative.  Negative for dysuria and hematuria.  Musculoskeletal:       Left foot pain and swelling around the heel and arch.  Skin: Negative.   Neurological: Negative.  Negative for dizziness, weakness and headaches.     Physical Exam Updated Vital Signs BP 118/85 (BP Location: Right Arm)   Pulse 65   Temp 98.1 F (36.7 C) (Oral)   Resp 18   Ht 5' (1.524 m)   Wt 77.1 kg (170 lb)   LMP 04/28/2018 (Approximate)   SpO2 100%   BMI 33.20 kg/m   Physical Exam  Constitutional: She is  oriented to person, place, and time. She appears well-developed and well-nourished. No distress.  HENT:  Head: Normocephalic and atraumatic.  Cardiovascular: Normal rate, regular rhythm, normal heart sounds and intact distal pulses. Exam reveals no gallop and no friction rub.  No murmur heard. Pulses:      Dorsalis pedis pulses are 2+ on the right side, and 2+ on the left side.       Posterior tibial pulses are 2+ on the right side, and 2+ on the left side.  Pulmonary/Chest: Effort normal and breath sounds normal. No respiratory distress.  Musculoskeletal:       Right foot: There is normal range of motion and no deformity.       Left foot: There is normal range of motion and no  deformity.  No evidence of infectious process; negative for swelling, warmth, streaking, erythema. Patient is tender to palpation around the left heel and left arch.  Feet:  Right Foot:  Protective Sensation: 3 sites tested. 3 sites sensed.  Skin Integrity: Positive for dry skin. Negative for ulcer, blister, skin breakdown, erythema or warmth.  Left Foot:  Protective Sensation: 3 sites tested. 3 sites sensed.  Skin Integrity: Positive for dry skin. Negative for ulcer, blister, skin breakdown, erythema or warmth.  Neurological: She is alert and oriented to person, place, and time.  Skin: Skin is warm and dry. No rash noted. She is not diaphoretic. No erythema.  Both legs below the knees exposed and examined together, there is no notable difference between the 2 lower legs, ankles or feet.  No noticeable swelling, erythema, warmth, wound, drainage, color change leg.  Psychiatric: She has a normal mood and affect. Her behavior is normal.     ED Treatments / Results  Labs (all labs ordered are listed, but only abnormal results are displayed) Labs Reviewed - No data to display  EKG None  Radiology Dg Foot Complete Left  Result Date: 05/29/2018 CLINICAL DATA:  Pain. EXAM: LEFT FOOT - COMPLETE 3+ VIEW COMPARISON:  None. FINDINGS: Soft tissue swelling and edema in the soft tissues adjacent to the calcaneus, consistent with history. No soft tissue gas is noted. No foreign body is identified. Enthesopathic changes are seen in the posterior calcaneus. No fractures or dislocations. No other abnormalities. IMPRESSION: Swelling in the soft tissues adjacent to the calcaneus. No foreign body. No underlying bony abnormality. Electronically Signed   By: Gerome Sam III M.D   On: 05/29/2018 18:33    Procedures Procedures (including critical care time)  Medications Ordered in ED Medications  ibuprofen (ADVIL,MOTRIN) tablet 400 mg (400 mg Oral Given 05/29/18 1719)     Initial Impression /  Assessment and Plan / ED Course  I have reviewed the triage vital signs and the nursing notes.  Pertinent labs & imaging results that were available during my care of the patient were reviewed by me and considered in my medical decision making (see chart for details).  Clinical Course as of May 29 1853  Fri May 29, 2018  1815 DG Foot Complete Left [BM]    Clinical Course User Index [BM] Bill Salinas, [place]    42 year old female presenting for left foot pain, pain in the heel and arch.  This pain has been present since January when the patient stepped on a toothpick.  Patient had toothpick removed by podiatrist and has healed well since her operation.  Patient presents today for increase in pain she states that she has not called her about podiatrist  because she does not remember his name.  Patient states that she is constantly on her feet at work and the pain has become much worse.  Unlikely that this is a infectious process due to to reassuring exam as well as lack of fever, drainage, warmth, wound or other signs or symptoms of infection.  X-rays were taken today and did not show a foreign body however they did show swelling of the soft tissues adjacent to the calcaneus.    Patient given referral back to her podiatrist and encouraged to make an appointment with him soon.  Patient also encouraged to use rest, ice, compression and elevation as needed.  Patient also informed that she can take NSAIDs over-the-counter for her pain as needed.  Patient given extensive return precautions, patient states understanding of return precautions and is agreeable with plan.  Final Clinical Impressions(s) / ED Diagnoses   Final diagnoses:  Foot pain, left    ED Discharge Orders    None       Elizabeth PalauMorelli, Katherine Syme A, PA-C 05/29/18 2253    Margarita Grizzleay, Danielle, MD 06/06/18 1623

## 2018-05-29 NOTE — Discharge Instructions (Addendum)
Take nonsteroidal anti-inflammatory medication, such as ibuprofen, for pain as needed. Follow-up with your primary care physician or your podiatrist.  Contact a health care provider if: Your pain does not get better after a few days of self-care. Your pain gets worse. You cannot stand on your foot. Get help right away if: Your foot is numb or tingling. Your foot or toes are swollen. Your foot or toes turn white or blue. You have warmth and redness along your foot. You develop a fever.

## 2018-05-29 NOTE — ED Triage Notes (Signed)
Patient reports that she had a toothpick stuck in her left heel x 2 months and had the toothpick removed. Patient has had increasing pain to the left heel and has a job where she is on her feet.

## 2018-06-12 ENCOUNTER — Encounter: Payer: Self-pay | Admitting: Podiatry

## 2018-06-12 ENCOUNTER — Ambulatory Visit (INDEPENDENT_AMBULATORY_CARE_PROVIDER_SITE_OTHER): Payer: 59 | Admitting: Podiatry

## 2018-06-12 DIAGNOSIS — M216X9 Other acquired deformities of unspecified foot: Secondary | ICD-10-CM

## 2018-06-12 DIAGNOSIS — M722 Plantar fascial fibromatosis: Secondary | ICD-10-CM

## 2018-06-12 DIAGNOSIS — L600 Ingrowing nail: Secondary | ICD-10-CM | POA: Diagnosis not present

## 2018-06-12 NOTE — Progress Notes (Signed)
  Subjective:  Patient ID: Tara May, female    DOB: June 30, 1976,  MRN: [id number]  Chief Complaint  Patient presents with  . Foot Pain    left foot arch area to bottom of heel medial side follow up; pt stated, "in the past month and a half foot has been giving me problems; swelling, still hard to put pressure on it; pain is in arch area down to bottom of heel to the inside, nothing is working to help with pain"  . Ingrown Toenail    right foot great toe lateral; pt stated, "noticed about 2wks ago, thats when pain started"    42 y.o. female returns for the above complaint. Reports pain to the L heel as above. Present for a month and a half. Complains of ingrown toenail to the R great toe x 2 weeks.  Objective:  There were no vitals filed for this visit. General AA&O x3. Normal mood and affect.  Vascular Pedal pulses palpable.  Neurologic Epicritic sensation grossly intact.  Dermatologic No open lesions. Skin normal texture and turgor. Ingrown nail lateral R hallux.  Orthopedic: POP L medial calc tuber POP R hallux nail lateral nail border.   Assessment & Plan:  Patient was evaluated and treated and all questions answered.  Ingrown Nail, right -Patient elects to proceed with ingrown toenail removal today -Ingrown nail excised. See procedure note. -Educated on post-procedure care including soaking. Written instructions provided. -Patient to follow up in 2 weeks for nail check.  Procedure: Excision of Ingrown Toenail Location: Right 1st toe lateral nail borders. Anesthesia: Lidocaine 1% plain; 1.135mL and Marcaine 0.5% plain; 1.605mL, digital block. Skin Prep: betadine. Dressing: Silvadene; telfa; dry, sterile, compression dressing. Technique: Following skin prep, the toe was exsanguinated and a tourniquet was secured at the base of the toe. The affected nail border was freed, split with a nail splitter, and excised. Chemical matrixectomy was then performed with phenol and irrigated  out with alcohol. The tourniquet was then removed and sterile dressing applied. Disposition: Patient tolerated procedure well. Patient to return in 2 weeks for follow-up.    Plantar Fasciitis, left - XR reviewed as above.  - Educated on icing and stretching. Instructions given.  - Injection delivered to the plantar fascia as below. - Night splint dispensed.  Procedure: Injection Tendon/Ligament Location: Left plantar fascia at the glabrous junction; medial approach. Skin Prep: Alcohol. Injectate: 1 cc 0.5% marcaine plain, 1 cc dexamethasone phosphate, 0.5 cc kenalog 10. Disposition: Patient tolerated procedure well. Injection site dressed with a band-aid.     -  Return in about 2 weeks (around 06/26/2018) for Plantar fasciitis and nail f/u.

## 2018-06-12 NOTE — Patient Instructions (Addendum)
Place 1/4 cup of epsom salts in a quart of warm tap water.  Submerge your foot or feet in the solution and soak for 20 minutes.  This soak should be done twice a day.  Next, remove your foot or feet from solution, blot dry the affected area. Apply ointment and cover if instructed by your doctor.   IF YOUR SKIN BECOMES IRRITATED WHILE USING THESE INSTRUCTIONS, IT IS OKAY TO SWITCH TO  WHITE VINEGAR AND WATER.  As another alternative soak, you may use antibacterial soap and water.  Monitor for any signs/symptoms of infection. Call the office immediately if any occur or go directly to the emergency room. Call with any questions/concerns. Long Term Care Instructions-Post Nail Surgery  You have had your ingrown toenail and root treated with a chemical.  This chemical causes a burn that will drain and ooze like a blister.  This can drain for 6-8 weeks or longer.  It is important to keep this area clean, covered, and follow the soaking instructions dispensed at the time of your surgery.  This area will eventually dry and form a scab.  Once the scab forms you no longer need to soak or apply a dressing.  If at any time you experience an increase in pain, redness, swelling, or drainage, you should contact the office as soon as possible.   Plantar Fasciitis (Heel Spur Syndrome) with Rehab The plantar fascia is a fibrous, ligament-like, soft-tissue structure that spans the bottom of the foot. Plantar fasciitis is a condition that causes pain in the foot due to inflammation of the tissue. SYMPTOMS   Pain and tenderness on the underneath side of the foot.  Pain that worsens with standing or walking. CAUSES  Plantar fasciitis is caused by irritation and injury to the plantar fascia on the underneath side of the foot. Common mechanisms of injury include:  Direct trauma to bottom of the foot.  Damage to a small nerve that runs under the foot where the main fascia attaches to the heel bone.  Stress placed on  the plantar fascia due to bone spurs. RISK INCREASES WITH:   Activities that place stress on the plantar fascia (running, jumping, pivoting, or cutting).  Poor strength and flexibility.  Improperly fitted shoes.  Tight calf muscles.  Flat feet.  Failure to warm-up properly before activity.  Obesity. PREVENTION  Warm up and stretch properly before activity.  Allow for adequate recovery between workouts.  Maintain physical fitness:  Strength, flexibility, and endurance.  Cardiovascular fitness.  Maintain a health body weight.  Avoid stress on the plantar fascia.  Wear properly fitted shoes, including arch supports for individuals who have flat feet.  PROGNOSIS  If treated properly, then the symptoms of plantar fasciitis usually resolve without surgery. However, occasionally surgery is necessary.  RELATED COMPLICATIONS   Recurrent symptoms that may result in a chronic condition.  Problems of the lower back that are caused by compensating for the injury, such as limping.  Pain or weakness of the foot during push-off following surgery.  Chronic inflammation, scarring, and partial or complete fascia tear, occurring more often from repeated injections.  TREATMENT  Treatment initially involves the use of ice and medication to help reduce pain and inflammation. The use of strengthening and stretching exercises may help reduce pain with activity, especially stretches of the Achilles tendon. These exercises may be performed at home or with a therapist. Your caregiver may recommend that you use heel cups of arch supports to help reduce   stress on the plantar fascia. Occasionally, corticosteroid injections are given to reduce inflammation. If symptoms persist for greater than 6 months despite non-surgical (conservative), then surgery may be recommended.   MEDICATION   If pain medication is necessary, then nonsteroidal anti-inflammatory medications, such as aspirin and ibuprofen,  or other minor pain relievers, such as acetaminophen, are often recommended.  Do not take pain medication within 7 days before surgery.  Prescription pain relievers may be given if deemed necessary by your caregiver. Use only as directed and only as much as you need.  Corticosteroid injections may be given by your caregiver. These injections should be reserved for the most serious cases, because they may only be given a certain number of times.  HEAT AND COLD  Cold treatment (icing) relieves pain and reduces inflammation. Cold treatment should be applied for 10 to 15 minutes every 2 to 3 hours for inflammation and pain and immediately after any activity that aggravates your symptoms. Use ice packs or massage the area with a piece of ice (ice massage).  Heat treatment may be used prior to performing the stretching and strengthening activities prescribed by your caregiver, physical therapist, or athletic trainer. Use a heat pack or soak the injury in warm water.  SEEK IMMEDIATE MEDICAL CARE IF:  Treatment seems to offer no benefit, or the condition worsens.  Any medications produce adverse side effects.  EXERCISES- RANGE OF MOTION (ROM) AND STRETCHING EXERCISES - Plantar Fasciitis (Heel Spur Syndrome) These exercises may help you when beginning to rehabilitate your injury. Your symptoms may resolve with or without further involvement from your physician, physical therapist or athletic trainer. While completing these exercises, remember:   Restoring tissue flexibility helps normal motion to return to the joints. This allows healthier, less painful movement and activity.  An effective stretch should be held for at least 30 seconds.  A stretch should never be painful. You should only feel a gentle lengthening or release in the stretched tissue.  RANGE OF MOTION - Toe Extension, Flexion  Sit with your right / left leg crossed over your opposite knee.  Grasp your toes and gently pull them  back toward the top of your foot. You should feel a stretch on the bottom of your toes and/or foot.  Hold this stretch for 10 seconds.  Now, gently pull your toes toward the bottom of your foot. You should feel a stretch on the top of your toes and or foot.  Hold this stretch for 10 seconds. Repeat  times. Complete this stretch 3 times per day.   RANGE OF MOTION - Ankle Dorsiflexion, Active Assisted  Remove shoes and sit on a chair that is preferably not on a carpeted surface.  Place right / left foot under knee. Extend your opposite leg for support.  Keeping your heel down, slide your right / left foot back toward the chair until you feel a stretch at your ankle or calf. If you do not feel a stretch, slide your bottom forward to the edge of the chair, while still keeping your heel down.  Hold this stretch for 10 seconds. Repeat 3 times. Complete this stretch 2 times per day.   STRETCH  Gastroc, Standing  Place hands on wall.  Extend right / left leg, keeping the front knee somewhat bent.  Slightly point your toes inward on your back foot.  Keeping your right / left heel on the floor and your knee straight, shift your weight toward the wall, not allowing your   back to arch.  You should feel a gentle stretch in the right / left calf. Hold this position for 10 seconds. Repeat 3 times. Complete this stretch 2 times per day.  STRETCH  Soleus, Standing  Place hands on wall.  Extend right / left leg, keeping the other knee somewhat bent.  Slightly point your toes inward on your back foot.  Keep your right / left heel on the floor, bend your back knee, and slightly shift your weight over the back leg so that you feel a gentle stretch deep in your back calf.  Hold this position for 10 seconds. Repeat 3 times. Complete this stretch 2 times per day.  STRETCH  Gastrocsoleus, Standing  Note: This exercise can place a lot of stress on your foot and ankle. Please complete this  exercise only if specifically instructed by your caregiver.   Place the ball of your right / left foot on a step, keeping your other foot firmly on the same step.  Hold on to the wall or a rail for balance.  Slowly lift your other foot, allowing your body weight to press your heel down over the edge of the step.  You should feel a stretch in your right / left calf.  Hold this position for 10 seconds.  Repeat this exercise with a slight bend in your right / left knee. Repeat 3 times. Complete this stretch 2 times per day.   STRENGTHENING EXERCISES - Plantar Fasciitis (Heel Spur Syndrome)  These exercises may help you when beginning to rehabilitate your injury. They may resolve your symptoms with or without further involvement from your physician, physical therapist or athletic trainer. While completing these exercises, remember:   Muscles can gain both the endurance and the strength needed for everyday activities through controlled exercises.  Complete these exercises as instructed by your physician, physical therapist or athletic trainer. Progress the resistance and repetitions only as guided.  STRENGTH - Towel Curls  Sit in a chair positioned on a non-carpeted surface.  Place your foot on a towel, keeping your heel on the floor.  Pull the towel toward your heel by only curling your toes. Keep your heel on the floor. Repeat 3 times. Complete this exercise 2 times per day.  STRENGTH - Ankle Inversion  Secure one end of a rubber exercise band/tubing to a fixed object (table, pole). Loop the other end around your foot just before your toes.  Place your fists between your knees. This will focus your strengthening at your ankle.  Slowly, pull your big toe up and in, making sure the band/tubing is positioned to resist the entire motion.  Hold this position for 10 seconds.  Have your muscles resist the band/tubing as it slowly pulls your foot back to the starting position. Repeat 3  times. Complete this exercises 2 times per day.  Document Released: 12/02/2005 Document Revised: 02/24/2012 Document Reviewed: 03/16/2009 [employer] Patient Information 2014 [employer].  

## 2018-06-26 ENCOUNTER — Ambulatory Visit: Payer: 59 | Admitting: Podiatry

## 2018-07-02 ENCOUNTER — Encounter: Payer: Self-pay | Admitting: Podiatry

## 2018-07-02 ENCOUNTER — Ambulatory Visit (INDEPENDENT_AMBULATORY_CARE_PROVIDER_SITE_OTHER): Payer: 59 | Admitting: Podiatry

## 2018-07-02 DIAGNOSIS — M722 Plantar fascial fibromatosis: Secondary | ICD-10-CM

## 2018-07-02 NOTE — Progress Notes (Signed)
ref

## 2018-07-06 ENCOUNTER — Emergency Department (HOSPITAL_COMMUNITY)
Admission: EM | Admit: 2018-07-06 | Discharge: 2018-07-06 | Disposition: A | Discharge status: Home / Self Care | Payer: Managed Care, Other (non HMO) | Attending: Emergency Medicine | Admitting: Emergency Medicine

## 2018-07-06 ENCOUNTER — Encounter (HOSPITAL_COMMUNITY): Payer: Self-pay | Admitting: Emergency Medicine

## 2018-07-06 DIAGNOSIS — K529 Noninfective gastroenteritis and colitis, unspecified: Secondary | ICD-10-CM | POA: Diagnosis not present

## 2018-07-06 DIAGNOSIS — R112 Nausea with vomiting, unspecified: Secondary | ICD-10-CM | POA: Diagnosis present

## 2018-07-06 LAB — URINALYSIS, ROUTINE W REFLEX MICROSCOPIC
Bilirubin Urine: NEGATIVE
GLUCOSE, UA: NEGATIVE mg/dL
Hgb urine dipstick: NEGATIVE
Ketones, ur: NEGATIVE mg/dL
Nitrite: NEGATIVE
Protein, ur: NEGATIVE mg/dL
SPECIFIC GRAVITY, URINE: 1.023 (ref 1.005–1.030)
pH: 5 (ref 5.0–8.0)

## 2018-07-06 MED ORDER — ONDANSETRON 8 MG PO TBDP
8.0000 mg | ORAL_TABLET | Freq: Once | ORAL | Status: AC
  Administered 2018-07-06: 8 mg via ORAL
  Filled 2018-07-06: qty 1

## 2018-07-06 MED ORDER — ONDANSETRON 8 MG PO TBDP: 8.0000 mg | ORAL_TABLET | Freq: Three times a day (TID) | ORAL | 0 refills | Status: DC | PRN

## 2018-07-06 NOTE — Progress Notes (Signed)
  Subjective:  Patient ID: Tara May, female    DOB: Aug 28, 1976,  MRN: [id number]  No chief complaint on file.  42 y.o. female returns for the above complaint.  States that she believes nothing is helping the left foot.  Reports pain now goes to the back of her leg.  8010 sharp pain.  Has been using a night splint and stretching.  Pressure hurts the most.  Objective:  There were no vitals filed for this visit. General AA&O x3. Normal mood and affect.  Vascular Pedal pulses palpable.  Neurologic Epicritic sensation grossly intact.  Dermatologic No open lesions. Skin normal texture and turgor.  Orthopedic:  Pain palpation about the left medial calcaneal tuber   Assessment & Plan:  Patient was evaluated and treated and all questions answered.  Plantar Fasciitis, left - XR reviewed as above.  - Educated on icing and stretching. Instructions given.  - Injection delivered to the plantar fascia as below. -Refer to PT  Procedure: Injection Tendon/Ligament Location: Left plantar fascia at the glabrous junction; medial approach. Skin Prep: Alcohol. Injectate: 1 cc 0.5% marcaine plain, 1 cc dexamethasone phosphate, 1 cc kenalog 10. Disposition: Patient tolerated procedure well. Injection site dressed with a band-aid.     No follow-ups on file.

## 2018-07-06 NOTE — ED Triage Notes (Signed)
Pt reports this morning she woke up with n/v. Denies pain, urinary problems or bowel problems.  Pt reports that she "hate to come in here since prob due to my antibodies but my work be trippin' and doctor office aint open this early."

## 2018-07-06 NOTE — ED Provider Notes (Signed)
[facility]-EMERGENCY DEPT Provider Note   CSN: [id number] Arrival date & time: 07/06/18  0703     History   Chief Complaint Chief Complaint  Patient presents with  . Emesis    HPI Tara May is a 42 y.o. female.  Patient c/o nausea and vomiting onset this AM. Symptoms acute onset, episodic, now improving. Emesis clear, not bloody or bilious. No abd pain. Denies diarrhea. No abd distension or constipation. No fever or chills. No dysuria or gu c/o. No known bad food ingestion or ill contacts.   The history is provided by the patient.  Emesis   Pertinent negatives include no abdominal pain, no cough, no fever and no headaches.    Past Medical History:  Diagnosis Date  . Hep C w/o coma, chronic (HCC)     There are no active problems to display for this patient.   Past Surgical History:  Procedure Laterality Date  . TUBAL LIGATION       OB History   None      Home Medications    Prior to Admission medications   Medication Sig Start Date End Date Taking? Authorizing Provider  acetaminophen (TYLENOL) 500 MG tablet Take 500 mg by mouth every 6 (six) hours as needed for mild pain.   Yes [provider]  meloxicam (MOBIC) 7.5 MG tablet Take 1 tablet (7.5 mg total) by mouth daily. Patient not taking: Reported on 07/06/2018 12/26/17   Lurline IdolYu, Amy V, PA-C    Family History Family History  Adopted: Yes    Social History Social History   Tobacco Use  . Smoking status: Never Smoker  . Smokeless tobacco: Never Used  Substance Use Topics  . Alcohol use: No  . Drug use: No     Allergies   Patient has no known allergies.   Review of Systems Review of Systems  Constitutional: Negative for fever.  HENT: Negative for sore throat.   Eyes: Negative for redness.  Respiratory: Negative for cough and shortness of breath.   Cardiovascular: Negative for chest pain.  Gastrointestinal: Positive for vomiting. Negative for abdominal pain.    Genitourinary: Negative for dysuria and flank pain.  Musculoskeletal: Negative for back pain and neck pain.  Skin: Negative for rash.  Neurological: Negative for headaches.  Hematological: Does not bruise/bleed easily.  Psychiatric/Behavioral: Negative for confusion.     Physical Exam Updated Vital Signs BP 121/84 (BP Location: Left Arm)   Pulse 73   Temp 98.2 F (36.8 C) (Oral)   Resp 17   LMP 06/10/2018   SpO2 100%   Physical Exam  Constitutional: She appears well-developed and well-nourished.  HENT:  Mouth/Throat: Oropharynx is clear and moist.  Eyes: Conjunctivae are normal. No scleral icterus.  Neck: Neck supple. No tracheal deviation present.  Cardiovascular: Normal rate, regular rhythm, normal heart sounds and intact distal pulses. Exam reveals no gallop and no friction rub.  No murmur heard. Pulmonary/Chest: Effort normal and breath sounds normal. No respiratory distress.  Abdominal: Soft. Normal appearance and bowel sounds are normal. She exhibits no distension. There is no tenderness. There is no guarding.  Genitourinary:  Genitourinary Comments: No cva tenderness  Musculoskeletal: She exhibits no edema.  Neurological: She is alert.  Skin: Skin is warm and dry. No rash noted.  Psychiatric: She has a normal mood and affect.  Nursing note and vitals reviewed.    ED Treatments / Results  Labs (all labs ordered are listed, but only abnormal results are  displayed) Labs Reviewed  URINALYSIS, ROUTINE W REFLEX MICROSCOPIC - Abnormal; Notable for the following components:      Result Value   APPearance HAZY (*)    Leukocytes, UA SMALL (*)    Bacteria, UA RARE (*)    All other components within normal limits    EKG None  Radiology No results found.  Procedures Procedures (including critical care time)  Medications Ordered in ED Medications  ondansetron (ZOFRAN-ODT) disintegrating tablet 8 mg (has no administration in time range)     Initial  Impression / Assessment and Plan / ED Course  I have reviewed the triage vital signs and the nursing notes.  Pertinent labs & imaging results that were available during my care of the patient were reviewed by me and considered in my medical decision making (see chart for details).  zofran po. Po fluids.  Reviewed nursing notes and prior charts for additional history.   Nausea and vomiting resolved. Tolerating po. Pt denies any urinary symptoms, no dysuria, urgency or frequency. No abd or flank pain.   Pt currently appears stable for d/c.     Final Clinical Impressions(s) / ED Diagnoses   Final diagnoses:  None    ED Discharge Orders    None       Cathren Laine, MD 07/06/18 [id number]

## 2018-07-06 NOTE — Discharge Instructions (Addendum)
It was our pleasure to provide your ER care today - we hope that you feel better.  Drink plenty of fluids. Take zofran as need if nauseated.   Return to ER if worse, new symptoms, new or severe abdominal pain, persistent vomiting, other concern.

## 2018-07-06 NOTE — ED Notes (Signed)
Patient tolerating PO intake well. Patient denies nausea/vomiting.

## 2018-07-06 NOTE — ED Notes (Signed)
This Clinical research associatewriter administered ordered SL Zofran. Patient provided with ginger ale. Will monitor patient tolerance.

## 2018-07-09 ENCOUNTER — Ambulatory Visit: Payer: 59 | Admitting: Podiatry

## 2018-07-13 ENCOUNTER — Encounter (HOSPITAL_COMMUNITY): Payer: Self-pay | Admitting: *Deleted

## 2018-07-13 ENCOUNTER — Other Ambulatory Visit: Payer: Self-pay

## 2018-07-13 ENCOUNTER — Emergency Department (HOSPITAL_COMMUNITY)
Admission: EM | Admit: 2018-07-13 | Discharge: 2018-07-14 | Disposition: A | Discharge status: Home / Self Care | Payer: Managed Care, Other (non HMO) | Attending: Emergency Medicine | Admitting: Emergency Medicine

## 2018-07-13 DIAGNOSIS — N760 Acute vaginitis: Secondary | ICD-10-CM | POA: Insufficient documentation

## 2018-07-13 DIAGNOSIS — B9689 Other specified bacterial agents as the cause of diseases classified elsewhere: Secondary | ICD-10-CM | POA: Insufficient documentation

## 2018-07-13 DIAGNOSIS — Z711 Person with feared health complaint in whom no diagnosis is made: Secondary | ICD-10-CM

## 2018-07-13 DIAGNOSIS — Z202 Contact with and (suspected) exposure to infections with a predominantly sexual mode of transmission: Secondary | ICD-10-CM | POA: Diagnosis present

## 2018-07-13 LAB — I-STAT BETA HCG BLOOD, ED (MC, WL, AP ONLY): I-stat hCG, quantitative: <5 m[IU]/mL (ref <5)

## 2018-07-13 NOTE — ED Triage Notes (Signed)
Pt reports she found a card in her boyfriend's wallet showing he was tested for HIV 3 weeks ago.  She has been with him over 3 years on and off.  She denies any sxs.  She is requesting to be tested for "everything."

## 2018-07-13 NOTE — Discharge Instructions (Addendum)
We will contact you with the results of remaining lab work when it is available.

## 2018-07-13 NOTE — ED Provider Notes (Signed)
[facility]-EMERGENCY DEPT Provider Note   CSN: [id number] Arrival date & time: 07/13/18  2102     History   Chief Complaint Chief Complaint  Patient presents with  . Exposure to STD    HPI Tara May is a 42 y.o. female with a past medical history of chronic hep C, who presents to ED for evaluation of STD testing.  States that she found a card in her boyfriend's while ensuring that he was tested for HIV 3 weeks ago.  She is unsure whether it was positive or negative.  She last had unprotected sexual intercourse yesterday.  Reports some vaginal discharge but states that that is baseline for her.  Denies any dysuria, abnormal vaginal bleeding, fever, back pain, abdominal pain.  HPI  Past Medical History:  Diagnosis Date  . Hep C w/o coma, chronic (HCC)     There are no active problems to display for this patient.   Past Surgical History:  Procedure Laterality Date  . TUBAL LIGATION       OB History   None      Home Medications    Prior to Admission medications   Medication Sig Start Date End Date Taking? Authorizing Provider  acetaminophen (TYLENOL) 500 MG tablet Take 500 mg by mouth every 6 (six) hours as needed for mild pain.   Yes [provider]  meloxicam (MOBIC) 7.5 MG tablet Take 1 tablet (7.5 mg total) by mouth daily. Patient not taking: Reported on 07/06/2018 12/26/17   Belinda FisherYu, Amy V, PA-C  metroNIDAZOLE (FLAGYL) 500 MG tablet Take 1 tablet (500 mg total) by mouth 2 (two) times daily. 07/14/18   Azaria Bartell, PA-C  ondansetron (ZOFRAN ODT) 8 MG disintegrating tablet Take 1 tablet (8 mg total) by mouth every 8 (eight) hours as needed for nausea or vomiting. 07/06/18   Cathren LaineSteinl, Kevin, MD    Family History Family History  Adopted: Yes    Social History Social History   Tobacco Use  . Smoking status: Never Smoker  . Smokeless tobacco: Never Used  Substance Use Topics  . Alcohol use: No  . Drug use: No     Allergies     Patient has no known allergies.   Review of Systems Review of Systems  Constitutional: Negative for appetite change, chills and fever.  HENT: Negative for ear pain, rhinorrhea, sneezing and sore throat.   Eyes: Negative for photophobia and visual disturbance.  Respiratory: Negative for cough, chest tightness, shortness of breath and wheezing.   Cardiovascular: Negative for chest pain and palpitations.  Gastrointestinal: Negative for abdominal pain, blood in stool, constipation, diarrhea, nausea and vomiting.  Genitourinary: Positive for vaginal discharge. Negative for dysuria, hematuria and urgency.  Musculoskeletal: Negative for myalgias.  Skin: Negative for rash.  Neurological: Negative for dizziness, weakness and light-headedness.     Physical Exam Updated Vital Signs BP (!) 144/94 (BP Location: Left Arm)   Pulse 83   Temp 98.3 F (36.8 C) (Oral)   Resp 18   LMP 07/09/2018   SpO2 100%   Physical Exam  Constitutional: She appears well-developed and well-nourished. No distress.  HENT:  Head: Normocephalic and atraumatic.  Nose: Nose normal.  Eyes: Conjunctivae and EOM are normal. Left eye exhibits no discharge. No scleral icterus.  Neck: Normal range of motion. Neck supple.  Cardiovascular: Normal rate, regular rhythm, normal heart sounds and intact distal pulses. Exam reveals no gallop and no friction rub.  No murmur heard. Pulmonary/Chest: Effort normal  and breath sounds normal. No respiratory distress.  Abdominal: Soft. Bowel sounds are normal. She exhibits no distension. There is no tenderness. There is no guarding.  Genitourinary:  Genitourinary Comments: Pelvic exam: normal external genitalia without evidence of trauma. VULVA: normal appearing vulva with no masses, tenderness or lesion. VAGINA: normal appearing vagina with normal color and discharge, no lesions. CERVIX: normal appearing cervix without lesions, cervical motion tenderness absent, cervical os closed  with out purulent discharge; No vaginal discharge. Wet prep and DNA probe for chlamydia and GC obtained.   ADNEXA: normal adnexa in size, nontender and no masses UTERUS: uterus is normal size, shape, consistency and nontender.    Musculoskeletal: Normal range of motion. She exhibits no edema.  Neurological: She is alert. She exhibits normal muscle tone. Coordination normal.  Skin: Skin is warm and dry. No rash noted.  Psychiatric: She has a normal mood and affect.  Nursing note and vitals reviewed.    ED Treatments / Results  Labs (all labs ordered are listed, but only abnormal results are displayed) Labs Reviewed  WET PREP, GENITAL - Abnormal; Notable for the following components:      Result Value   Clue Cells Wet Prep HPF POC PRESENT (*)    WBC, Wet Prep HPF POC MODERATE (*)    All other components within normal limits  HIV ANTIBODY (ROUTINE TESTING)  RPR  I-STAT BETA HCG BLOOD, ED ([facility], [facility], [facility] ONLY)  GC/CHLAMYDIA PROBE AMP ([facility]) NOT AT [facility]    EKG None  Radiology No results found.  Procedures Procedures (including critical care time)  Medications Ordered in ED Medications - No data to display   Initial Impression / Assessment and Plan / ED Course  I have reviewed the triage vital signs and the nursing notes.  Pertinent labs & imaging results that were available during my care of the patient were reviewed by me and considered in my medical decision making (see chart for details).     42 year old female presents to ED for concern for STDs.  She saw that her boyfriend whom she is sexually active with without protection tested for HIV 3 weeks ago.  She is unsure if it was a positive or negative result.  She reports some vaginal discharge but denies any other complaints.  Pelvic exam revealed no abnormalities.  Wet prep shows clue cells and WBCs.  Gonorrhea and chlamydia, HIV, RPR pending at this time.  Will advise patient to follow-up with remaining lab work when  it is available.  Will discharge home with Flagyl.  Advised to return to ED for any severe worsening symptoms.  Final Clinical Impressions(s) / ED Diagnoses   Final diagnoses:  Concern about STD in female without diagnosis  Bacterial vaginosis    ED Discharge Orders        Ordered    metroNIDAZOLE (FLAGYL) 500 MG tablet  2 times daily     07/14/18 0028       Dietrich Pates, PA-C 07/14/18 0028    Charlynne Pander, MD 07/14/18 1504

## 2018-07-14 LAB — WET PREP, GENITAL
Sperm: NONE SEEN
TRICH WET PREP: NONE SEEN
YEAST WET PREP: NONE SEEN

## 2018-07-14 LAB — HIV ANTIBODY (ROUTINE TESTING W REFLEX): HIV Screen 4th Generation wRfx: NONREACTIVE

## 2018-07-14 LAB — RPR: RPR Ser Ql: NONREACTIVE

## 2018-07-14 MED ORDER — METRONIDAZOLE 500 MG PO TABS: 500.0000 mg | ORAL_TABLET | Freq: Two times a day (BID) | ORAL | 0 refills | Status: DC

## 2018-07-15 LAB — GC/CHLAMYDIA PROBE AMP (~~LOC~~) NOT AT ARMC
CHLAMYDIA, DNA PROBE: NEGATIVE
NEISSERIA GONORRHEA: NEGATIVE

## 2018-07-20 ENCOUNTER — Encounter (HOSPITAL_COMMUNITY): Payer: Self-pay | Admitting: Emergency Medicine

## 2018-07-20 ENCOUNTER — Ambulatory Visit (HOSPITAL_COMMUNITY)
Admission: EM | Admit: 2018-07-20 | Discharge: 2018-07-20 | Disposition: A | Discharge status: Home / Self Care | Payer: Managed Care, Other (non HMO) | Attending: Family Medicine | Admitting: Family Medicine

## 2018-07-20 ENCOUNTER — Ambulatory Visit (HOSPITAL_COMMUNITY)
Admission: EM | Admit: 2018-07-20 | Discharge: 2018-07-20 | Disposition: A | Discharge status: Home / Self Care | Payer: Managed Care, Other (non HMO)

## 2018-07-20 DIAGNOSIS — R21 Rash and other nonspecific skin eruption: Secondary | ICD-10-CM | POA: Diagnosis not present

## 2018-07-20 MED ORDER — PREDNISONE 20 MG PO TABS: 40.0000 mg | ORAL_TABLET | Freq: Every day | ORAL | 0 refills | Status: AC

## 2018-07-20 MED ORDER — CETIRIZINE HCL 10 MG PO TABS: 10.0000 mg | ORAL_TABLET | Freq: Every day | ORAL | 0 refills | Status: DC

## 2018-07-20 MED ORDER — PERMETHRIN 5 % EX CREA: TOPICAL_CREAM | CUTANEOUS | 0 refills | Status: DC

## 2018-07-20 MED ORDER — TRIAMCINOLONE ACETONIDE 0.1 % EX CREA: 1.0000 "application " | TOPICAL_CREAM | Freq: Two times a day (BID) | CUTANEOUS | 0 refills | Status: DC

## 2018-07-20 NOTE — ED Triage Notes (Signed)
Pt c/o rash on her legs and L arm. x1 week.

## 2018-07-20 NOTE — ED Notes (Signed)
Pt returned to get a note for work explaining treatment and getting a clearance to return to work. Pt received note and left

## 2018-07-20 NOTE — Discharge Instructions (Signed)
Some areas of this rash are concerning for scabies, therefore I do recommend treatment for this with Permethrin. I have sent this to the pharmacy if you need. Apply neck to toes and leave on for 8-10 hours and then rinse. May repeat in 2 weeks if symptoms persist.  This may also be more of a dermatitis. We will cover this with 3 days of prednisone as well as topical steroid cream to help with the itching. May use daily zyrtec which may further help with rash and itching.  If symptoms worsen or do not improve in the next 2-3 weeks to return to be seen or to follow up with your PCP.

## 2018-07-20 NOTE — ED Provider Notes (Signed)
[facility]    CSN: [id number] Arrival date & time: 07/20/18  1033     History   Chief Complaint Chief Complaint  Patient presents with  . Rash    HPI Tara May is a 42 y.o. female.   Tara May presents with complaints of rash which has been present for the past week. Started out to her thighs and backs of legs and is now to her arms and back. Has been applying hydrocortisone cream and anti-itch cream which only mildly helps with the itching. No pain. No drainage. Denies any previous similar rash. Itching is worse at night. No others in the household with rash. No known ill contacts. Taking flagyl but rash was present prior to starting and has taken this before without rash. She does work at a nursing home. No other known new products/soaps/exposures. Denies fevers, URI symptoms, gi/gu complaints.    ROS per HPI.      Past Medical History:  Diagnosis Date  . Hep C w/o coma, chronic (HCC)     There are no active problems to display for this patient.   Past Surgical History:  Procedure Laterality Date  . TUBAL LIGATION      OB History   None      Home Medications    Prior to Admission medications   Medication Sig Start Date End Date Taking? Authorizing Provider  metroNIDAZOLE (FLAGYL) 500 MG tablet Take 1 tablet (500 mg total) by mouth 2 (two) times daily. 07/14/18  Yes Law, Waylan BogaAlexandra M, PA-C  acetaminophen (TYLENOL) 500 MG tablet Take 500 mg by mouth every 6 (six) hours as needed for mild pain.    [provider]  cetirizine (ZYRTEC) 10 MG tablet Take 1 tablet (10 mg total) by mouth daily. 07/20/18   Georgetta HaberBurky, Gaspare Netzel B, NP  ondansetron (ZOFRAN ODT) 8 MG disintegrating tablet Take 1 tablet (8 mg total) by mouth every 8 (eight) hours as needed for nausea or vomiting. 07/06/18   Cathren LaineSteinl, Kevin, MD  permethrin (ELIMITE) 5 % cream Apply to neck to toes, keep on skin for 8-12 hours then rinse off. May repeat in two weeks if needed 07/20/18   Linus MakoBurky, Solace Wendorff  B, NP  predniSONE (DELTASONE) 20 MG tablet Take 2 tablets (40 mg total) by mouth daily with breakfast for 3 days. 07/20/18 07/23/18  Linus MakoBurky, Danisa Kopec B, NP  triamcinolone cream (KENALOG) 0.1 % Apply 1 application topically 2 (two) times daily. 07/20/18   Georgetta HaberBurky, Sedonia Kitner B, NP    Family History Family History  Adopted: Yes    Social History Social History   Tobacco Use  . Smoking status: Never Smoker  . Smokeless tobacco: Never Used  Substance Use Topics  . Alcohol use: No  . Drug use: No     Allergies   Patient has no known allergies.   Review of Systems Review of Systems   Physical Exam Triage Vital Signs ED Triage Vitals [07/20/18 1056]  Enc Vitals Group     BP 119/72     Pulse Rate 80     Resp 16     Temp 97.9 F (36.6 C)     Temp src      SpO2 99 %     Weight      Height      Head Circumference      Peak Flow      Pain Score      Pain Loc      Pain Edu?  Excl. in GC?    No data found.  Updated Vital Signs BP 119/72   Pulse 80   Temp 97.9 F (36.6 C)   Resp 16   LMP 07/09/2018   SpO2 99%    Physical Exam  Constitutional: She is oriented to person, place, and time. She appears well-developed and well-nourished. No distress.  Cardiovascular: Normal rate, regular rhythm and normal heart sounds.  Pulmonary/Chest: Effort normal and breath sounds normal.  Neurological: She is alert and oriented to person, place, and time.  Skin: Skin is warm and dry. Rash noted.  Fine rash, slightly raised, blanchable, red, to bilateral forearms and to bilateral calves and behind knees; some scabbing from scratching noted; some in linear pattern noted; patient endorses some rash to right foot; no new rash to hands- chronic rash to hands; see photos           UC Treatments / Results  Labs (all labs ordered are listed, but only abnormal results are displayed) Labs Reviewed - No data to display  EKG None  Radiology No results found.  Procedures Procedures  (including critical care time)  Medications Ordered in UC Medications - No data to display  Initial Impression / Assessment and Plan / UC Course  I have reviewed the triage vital signs and the nursing notes.  Pertinent labs & imaging results that were available during my care of the patient were reviewed by me and considered in my medical decision making (see chart for details).     Nears bends of arms and legs with itching worse at night a few in linear pattern, concern for possible scabies. No others in house with it however and other aspects of rash inconsistent with scabies, therefore will also treat as dermatitis to help with itching symptoms as well. Follow up with PCP for recheck as needed. Patient verbalized understanding and agreeable to plan.    Final Clinical Impressions(s) / UC Diagnoses   Final diagnoses:  Rash and nonspecific skin eruption     Discharge Instructions     Some areas of this rash are concerning for scabies, therefore I do recommend treatment for this with Permethrin. I have sent this to the pharmacy if you need. Apply neck to toes and leave on for 8-10 hours and then rinse. May repeat in 2 weeks if symptoms persist.  This may also be more of a dermatitis. We will cover this with 3 days of prednisone as well as topical steroid cream to help with the itching. May use daily zyrtec which may further help with rash and itching.  If symptoms worsen or do not improve in the next 2-3 weeks to return to be seen or to follow up with your PCP.     ED Prescriptions    Medication Sig Dispense Auth. Provider   permethrin (ELIMITE) 5 % cream Apply to neck to toes, keep on skin for 8-12 hours then rinse off. May repeat in two weeks if needed 60 g Linus Mako B, NP   predniSONE (DELTASONE) 20 MG tablet Take 2 tablets (40 mg total) by mouth daily with breakfast for 3 days. 6 tablet Linus Mako B, NP   cetirizine (ZYRTEC) 10 MG tablet Take 1 tablet (10 mg total) by  mouth daily. 30 tablet Linus Mako B, NP   triamcinolone cream (KENALOG) 0.1 % Apply 1 application topically 2 (two) times daily. 30 g Georgetta Haber, NP     Controlled Substance Prescriptions [place] Controlled Substance Registry consulted? Not  Applicable   Georgetta Haber, NP 07/20/18 1143

## 2018-07-23 ENCOUNTER — Ambulatory Visit (INDEPENDENT_AMBULATORY_CARE_PROVIDER_SITE_OTHER): Payer: 59 | Admitting: Podiatry

## 2018-07-23 DIAGNOSIS — M722 Plantar fascial fibromatosis: Secondary | ICD-10-CM | POA: Diagnosis not present

## 2018-07-29 ENCOUNTER — Telehealth: Payer: Self-pay | Admitting: Podiatry

## 2018-07-29 ENCOUNTER — Telehealth: Payer: Self-pay

## 2018-07-29 NOTE — Telephone Encounter (Signed)
Pt is having some pain after PT she wanted to know id she needs to come in tomorrow after her PT session at 3:15pm tomorrow. Please give her a call.

## 2018-07-29 NOTE — Telephone Encounter (Signed)
Patient called stating that she is having continued pain in her arch, and she is noticed some swelling in that area.  She recently saw Dr. Samuella CotaPrice on 07/23/2018 and was given an injection.  She called today wondering what other treatment she could do to help with her foot pain.  Advised her that we could dispense a boot and that the cam boot would help offload the pressure on her foot and help reduce some of the pain and allow the foot to rest.  I did tell her to come by the office after her PT session tomorrow to pick up a boot, she states she would need a toe guard to go with the boot in order to wear at work.  I told her to come by and pick both items up and that someone will show her how to use the toe guard, I did tell her to take both the boot and the toe guard in to work with her before using them to make sure that they will be adequate for her to wear to work.

## 2018-07-30 ENCOUNTER — Encounter: Payer: Self-pay | Admitting: Podiatry

## 2018-07-30 ENCOUNTER — Ambulatory Visit (INDEPENDENT_AMBULATORY_CARE_PROVIDER_SITE_OTHER): Payer: 59 | Admitting: Podiatry

## 2018-07-30 DIAGNOSIS — M216X9 Other acquired deformities of unspecified foot: Secondary | ICD-10-CM

## 2018-07-30 DIAGNOSIS — M722 Plantar fascial fibromatosis: Secondary | ICD-10-CM

## 2018-07-30 DIAGNOSIS — M659 Synovitis and tenosynovitis, unspecified: Secondary | ICD-10-CM | POA: Diagnosis not present

## 2018-07-30 MED ORDER — METHYLPREDNISOLONE 4 MG PO TBPK
ORAL_TABLET | ORAL | 0 refills | Status: DC
Start: 2018-07-30 — End: 2018-08-08

## 2018-08-05 NOTE — Progress Notes (Signed)
  Subjective:  Patient ID: Tara May, female    DOB: 1976/08/19,  MRN: [id number]  Chief Complaint  Patient presents with  . Plantar Fasciitis    F/U L PF Pt. stated," It's improving, it's not hurting as bad, now the pain is tolerable; 4/10." Tx: PT, stretching, and icing   42 y.o. female returns for the above complaint.  States it is not hurting as bad. States the injection helped.  Objective:  There were no vitals filed for this visit. General AA&O x3. Normal mood and affect.  Vascular Pedal pulses palpable.  Neurologic Epicritic sensation grossly intact.  Dermatologic No open lesions. Skin normal texture and turgor.  Orthopedic:  Pain palpation about the left medial calcaneal tuber   Assessment & Plan:  Patient was evaluated and treated and all questions answered.  Plantar Fasciitis, left - Continue PT to MMI. - Injection #2 as below.  Procedure: Injection Tendon/Ligament Consent: Verbal consent obtained. Location: Left plantar fascia at the glabrous junction; medial approach. Skin Prep: Alcohol. Injectate: 1 cc 0.5% marcaine plain, 1 cc dexamethasone phosphate, 0.5 cc kenalog 10. Disposition: Patient tolerated procedure well. Injection site dressed with a band-aid.  F/u in 3 weeks for recheck.

## 2018-08-05 NOTE — Progress Notes (Signed)
  Subjective:  Patient ID: Roque LiasWendy A Muellner, female    DOB: 02/10/1976,  MRN: [id number]  Chief Complaint  Patient presents with  . Plantar Fasciitis    left - was doing better until PT on Tuesday - acute pain since then   42 y.o. female returns for the above complaint. Thought PT was making it better but has had acute pain since Tuesday. Injection helped but eventually wore off.  Objective:  There were no vitals filed for this visit. General AA&O x3. Normal mood and affect.  Vascular Pedal pulses palpable.  Neurologic Epicritic sensation grossly intact.  Dermatologic No open lesions. Skin normal texture and turgor.  Orthopedic:  Pain palpation about the left medial calcaneal tuber   Assessment & Plan:  Patient was evaluated and treated and all questions answered.  Plantar Fasciitis, left - Continue PT to MMI. - Hold off injection today. - Rx Medrol pack. Educated on proper taking of medication. - Recommend rest today and tomorrow. Discussed limiting work hours due to pain from prolonged activity.  Return in about 4 weeks (around 08/27/2018) for Plantar fasciitis.

## 2018-08-08 ENCOUNTER — Emergency Department (HOSPITAL_COMMUNITY): Payer: 59

## 2018-08-08 ENCOUNTER — Emergency Department (HOSPITAL_COMMUNITY)
Admission: EM | Admit: 2018-08-08 | Discharge: 2018-08-08 | Disposition: A | Discharge status: Home / Self Care | Payer: 59 | Attending: Emergency Medicine | Admitting: Emergency Medicine

## 2018-08-08 ENCOUNTER — Encounter (HOSPITAL_COMMUNITY): Payer: Self-pay | Admitting: Emergency Medicine

## 2018-08-08 DIAGNOSIS — R51 Headache: Secondary | ICD-10-CM | POA: Insufficient documentation

## 2018-08-08 DIAGNOSIS — Z79899 Other long term (current) drug therapy: Secondary | ICD-10-CM | POA: Insufficient documentation

## 2018-08-08 DIAGNOSIS — Z23 Encounter for immunization: Secondary | ICD-10-CM | POA: Insufficient documentation

## 2018-08-08 LAB — I-STAT BETA HCG BLOOD, ED (MC, WL, AP ONLY): I-stat hCG, quantitative: <5 m[IU]/mL (ref <5)

## 2018-08-08 MED ORDER — NAPROXEN 500 MG PO TABS: 500.0000 mg | ORAL_TABLET | Freq: Two times a day (BID) | ORAL | 0 refills | Status: DC

## 2018-08-08 MED ORDER — OXYCODONE-ACETAMINOPHEN 5-325 MG PO TABS
1.0000 | ORAL_TABLET | Freq: Once | ORAL | Status: AC
  Administered 2018-08-08: 1 via ORAL
  Filled 2018-08-08: qty 1

## 2018-08-08 MED ORDER — METHOCARBAMOL 500 MG PO TABS: 500.0000 mg | ORAL_TABLET | Freq: Three times a day (TID) | ORAL | 0 refills | Status: DC | PRN

## 2018-08-08 MED ORDER — TETANUS-DIPHTH-ACELL PERTUSSIS 5-2.5-18.5 LF-MCG/0.5 IM SUSP
0.5000 mL | Freq: Once | INTRAMUSCULAR | Status: AC
  Administered 2018-08-08: 0.5 mL via INTRAMUSCULAR
  Filled 2018-08-08: qty 0.5

## 2018-08-08 MED ORDER — KETOROLAC TROMETHAMINE 15 MG/ML IJ SOLN
15.0000 mg | Freq: Once | INTRAMUSCULAR | Status: AC
Start: 2018-08-08 — End: 2018-08-08
  Administered 2018-08-08: 15 mg via INTRAVENOUS
  Filled 2018-08-08: qty 1
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
## 2018-08-08 NOTE — ED Notes (Signed)
Patient transported to CT scan . 
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
## 2018-08-08 NOTE — ED Notes (Signed)
Patient currently at radiology.
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
## 2018-08-12 ENCOUNTER — Other Ambulatory Visit: Payer: Self-pay

## 2018-08-12 ENCOUNTER — Emergency Department (HOSPITAL_COMMUNITY)
Admission: EM | Admit: 2018-08-12 | Discharge: 2018-08-12 | Disposition: A | Discharge status: Home / Self Care | Payer: Managed Care, Other (non HMO) | Attending: Emergency Medicine | Admitting: Emergency Medicine

## 2018-08-12 ENCOUNTER — Encounter (HOSPITAL_COMMUNITY): Payer: Self-pay | Admitting: Emergency Medicine

## 2018-08-12 DIAGNOSIS — R109 Unspecified abdominal pain: Secondary | ICD-10-CM

## 2018-08-12 DIAGNOSIS — M545 Low back pain, unspecified: Secondary | ICD-10-CM

## 2018-08-12 DIAGNOSIS — R1084 Generalized abdominal pain: Secondary | ICD-10-CM | POA: Diagnosis not present

## 2018-08-12 DIAGNOSIS — G44319 Acute post-traumatic headache, not intractable: Secondary | ICD-10-CM

## 2018-08-12 DIAGNOSIS — R51 Headache: Secondary | ICD-10-CM | POA: Diagnosis present

## 2018-08-12 LAB — COMPREHENSIVE METABOLIC PANEL
ALK PHOS: 52 U/L (ref 38–126)
ALT: 19 U/L (ref 0–44)
ANION GAP: 10 (ref 5–15)
AST: 17 U/L (ref 15–41)
Albumin: 3.3 g/dL — ABNORMAL LOW (ref 3.5–5.0)
BILIRUBIN TOTAL: 0.6 mg/dL (ref 0.3–1.2)
BUN: 14 mg/dL (ref 6–20)
CO2: 22 mmol/L (ref 22–32)
Calcium: 8.4 mg/dL — ABNORMAL LOW (ref 8.9–10.3)
Chloride: 107 mmol/L (ref 98–111)
Creatinine, Ser: 0.84 mg/dL (ref 0.44–1.00)
GFR calc non Af Amer: >60 mL/min (ref >60)
Glucose, Bld: 122 mg/dL — ABNORMAL HIGH (ref 70–99)
POTASSIUM: 3.9 mmol/L (ref 3.5–5.1)
SODIUM: 139 mmol/L (ref 135–145)
TOTAL PROTEIN: 6.2 g/dL — AB (ref 6.5–8.1)

## 2018-08-12 LAB — URINALYSIS, ROUTINE W REFLEX MICROSCOPIC
Glucose, UA: NEGATIVE mg/dL
Hgb urine dipstick: NEGATIVE
Ketones, ur: 5 mg/dL — AB
Leukocytes, UA: NEGATIVE
NITRITE: NEGATIVE
Protein, ur: NEGATIVE mg/dL
SPECIFIC GRAVITY, URINE: 1.042 — AB (ref 1.005–1.030)
pH: 5 (ref 5.0–8.0)

## 2018-08-12 LAB — CBC
HCT: 40.7 % (ref 36.0–46.0)
HEMOGLOBIN: 13.2 g/dL (ref 12.0–15.0)
MCH: 29.3 pg (ref 26.0–34.0)
MCHC: 32.4 g/dL (ref 30.0–36.0)
MCV: 90.4 fL (ref 78.0–100.0)
Platelets: 275 10*3/uL (ref 150–400)
RBC: 4.5 MIL/uL (ref 3.87–5.11)
RDW: 13 % (ref 11.5–15.5)
WBC: 11 10*3/uL — ABNORMAL HIGH (ref 4.0–10.5)

## 2018-08-12 LAB — LIPASE, BLOOD: Lipase: 35 U/L (ref 11–51)

## 2018-08-12 MED ORDER — HYDROCODONE-ACETAMINOPHEN 5-325 MG PO TABS
1.0000 | ORAL_TABLET | Freq: Once | ORAL | Status: AC
  Administered 2018-08-12: 1 via ORAL
  Filled 2018-08-12: qty 1

## 2018-08-12 MED ORDER — HYDROCODONE-ACETAMINOPHEN 5-325 MG PO TABS: 1.0000 | ORAL_TABLET | ORAL | 0 refills | Status: DC | PRN

## 2018-08-12 NOTE — Discharge Instructions (Signed)
Apply ice to sore areas for 30 minutes at a time, 3-4 times a day. Continue taking naproxen. Take acetaminophen for less severe pain, hydrocodone-acetaminophen for more severe pain.
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
## 2018-08-20 ENCOUNTER — Ambulatory Visit (INDEPENDENT_AMBULATORY_CARE_PROVIDER_SITE_OTHER): Payer: 59 | Admitting: Podiatry

## 2018-08-20 ENCOUNTER — Encounter: Payer: Self-pay | Admitting: Podiatry

## 2018-08-20 ENCOUNTER — Telehealth: Payer: Self-pay | Admitting: Podiatry

## 2018-08-20 DIAGNOSIS — M722 Plantar fascial fibromatosis: Secondary | ICD-10-CM | POA: Diagnosis not present

## 2018-08-20 NOTE — Telephone Encounter (Signed)
Pt. Said her plantar fas has flared up again and is swollen. She has Physical Therapy today at 3 and was wondering does she need to come in today to be seen.

## 2018-08-20 NOTE — Progress Notes (Signed)
  Subjective:  Patient ID: Tara May, female    DOB: Mar 17, 1976,  MRN: [id number]  Chief Complaint  Patient presents with  . Plantar Fasciitis    (acute)plantars fasc;severe pain    42 y.o. female presents with the above complaint.  Complains of acute left heel pain for the past 2 days.  Has been going to therapy and states that has otherwise been doing well but she experienced doing things. 319.  Getting very frustrated that this is not going away.   Review of Systems: Negative except as noted in the HPI. Denies N/V/F/Ch.  Past Medical History:  Diagnosis Date  . Hep C w/o coma, chronic (HCC)     Current Outpatient Medications:  .  HYDROcodone-acetaminophen (NORCO) 5-325 MG tablet, Take 1 tablet by mouth every 4 (four) hours as needed for moderate pain., Disp: 15 tablet, Rfl: 0 .  methocarbamol (ROBAXIN) 500 MG tablet, Take 1 tablet (500 mg total) by mouth every 8 (eight) hours as needed. (Patient taking differently: Take 500 mg by mouth every 8 (eight) hours as needed for muscle spasms. ), Disp: 30 tablet, Rfl: 0 .  naproxen (NAPROSYN) 500 MG tablet, Take 1 tablet (500 mg total) by mouth 2 (two) times daily., Disp: 30 tablet, Rfl: 0  Social History   Tobacco Use  Smoking Status Never Smoker  Smokeless Tobacco Never Used    No Known Allergies Objective:  There were no vitals filed for this visit. There is no height or weight on file to calculate BMI. Constitutional Well developed. Well nourished.  Vascular Dorsalis pedis pulses palpable bilaterally. Posterior tibial pulses palpable bilaterally. Capillary refill normal to all digits.  No cyanosis or clubbing noted. Pedal hair growth normal.  Neurologic Normal speech. Oriented to person, place, and time. Epicritic sensation to light touch grossly present bilaterally.  Dermatologic Nails well groomed and normal in appearance. No open wounds. No skin lesions.  Orthopedic: Normal joint ROM without pain or crepitus  bilaterally. No visible deformities. Tender to palpation at the calcaneal tuber left. No pain with calcaneal squeeze left. Ankle ROM diminished range of motion left. Silfverskiold Test: positive left.   Radiographs: None today  Assessment:   1. Plantar fasciitis    Plan:  Patient was evaluated and treated and all questions answered.  Plantar Fasciitis, left -Discussed continued stretching and icing -Injection delivered to the plantar fascia as below. -Should pain persist would consider MRI.  -Continue plantar fascial brace  Procedure: Injection Tendon/Ligament Location: Left plantar fascia at the glabrous junction; medial approach. Skin Prep: alcohol Injectate: 1 cc 0.5% marcaine plain, 1 cc dexamethasone phosphate, 0.5 cc kenalog 10. Disposition: Patient tolerated procedure well. Injection site dressed with a band-aid.  Return in about 3 weeks (around 09/10/2018) for Plantar fasciitis.

## 2018-08-20 NOTE — Telephone Encounter (Signed)
I offered pt 3:45pm today with Dr. Samuella Cota, pt accepted.

## 2018-08-27 ENCOUNTER — Ambulatory Visit: Payer: 59 | Admitting: Podiatry

## 2018-09-10 ENCOUNTER — Ambulatory Visit: Payer: 59 | Admitting: Podiatry

## 2019-08-09 IMAGING — DX DG FOOT COMPLETE 3+V*L*
3 series · 3 of 3 positions shown · non-contrast
Comparison: None.

CLINICAL DATA: Stepped on toothpick yesterday with persistent pain,
initial encounter

EXAM:
LEFT FOOT - COMPLETE 3+ VIEW

[foot ap]
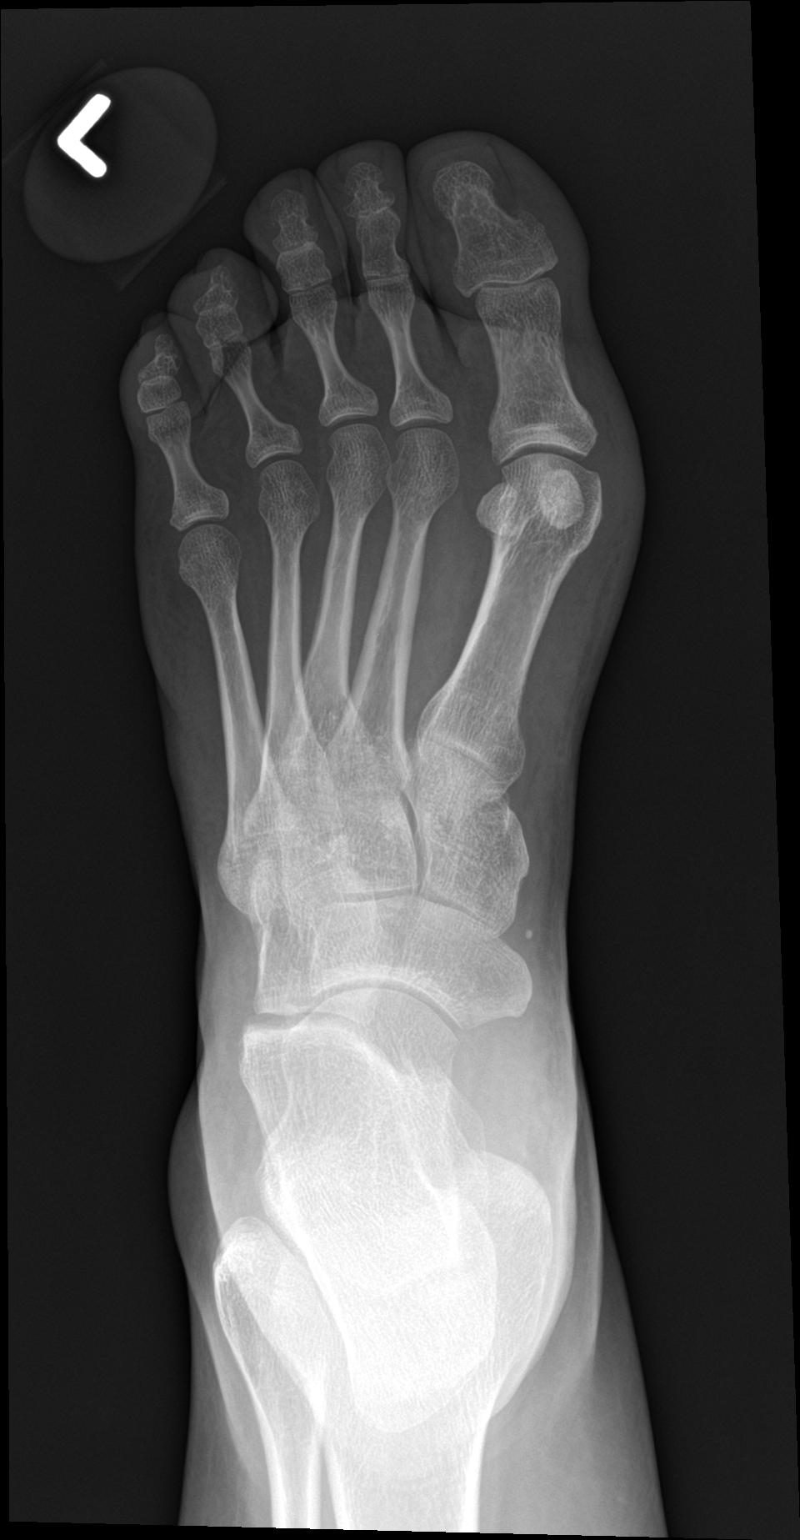

[foot obl]
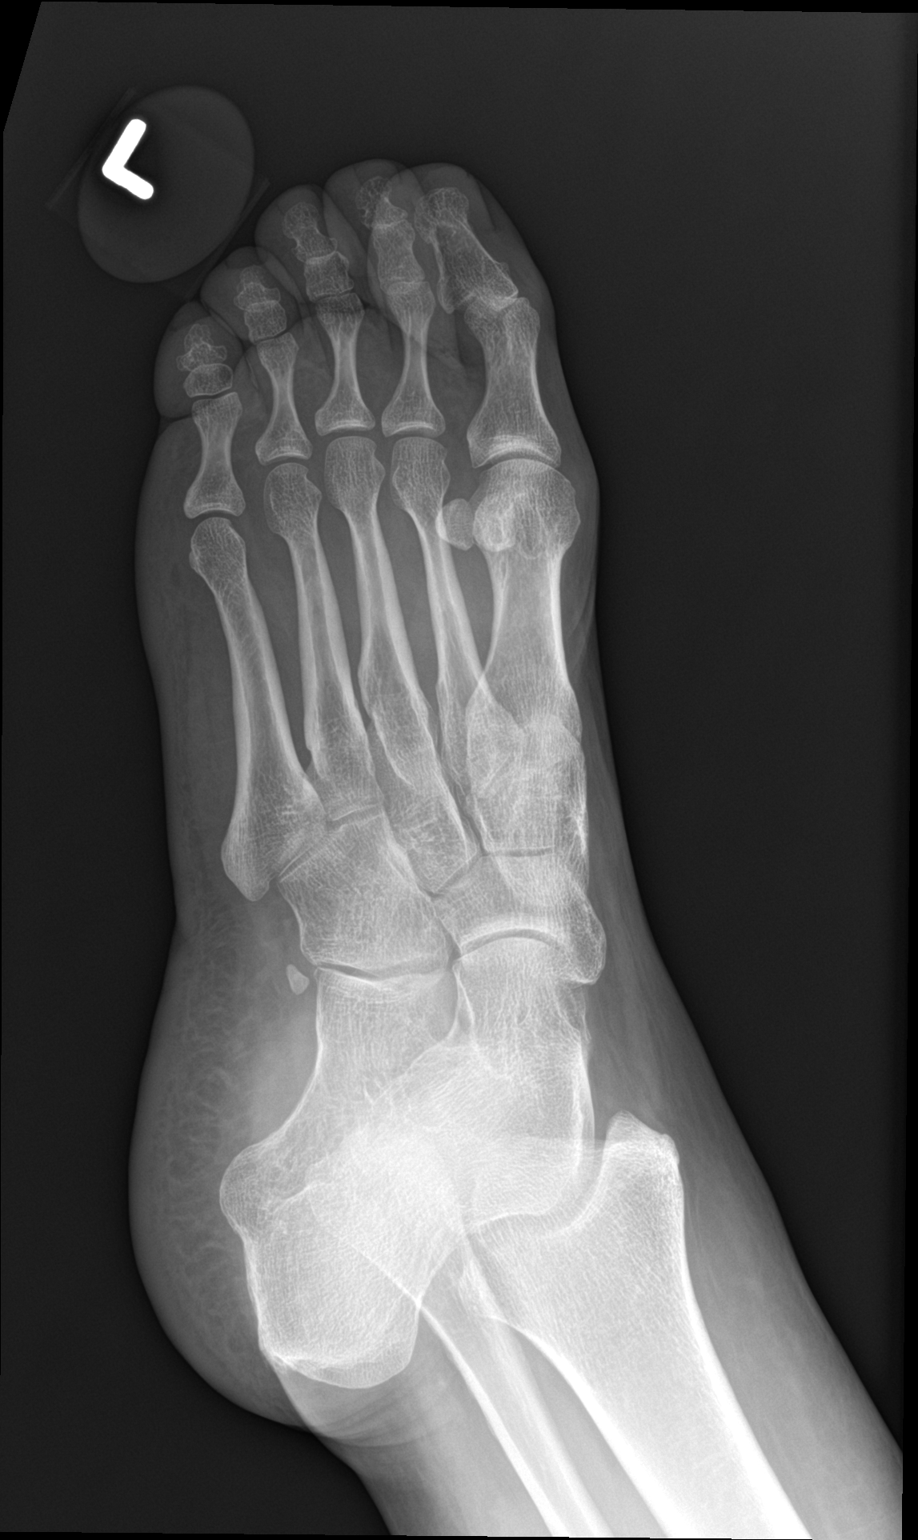

[foot lat]
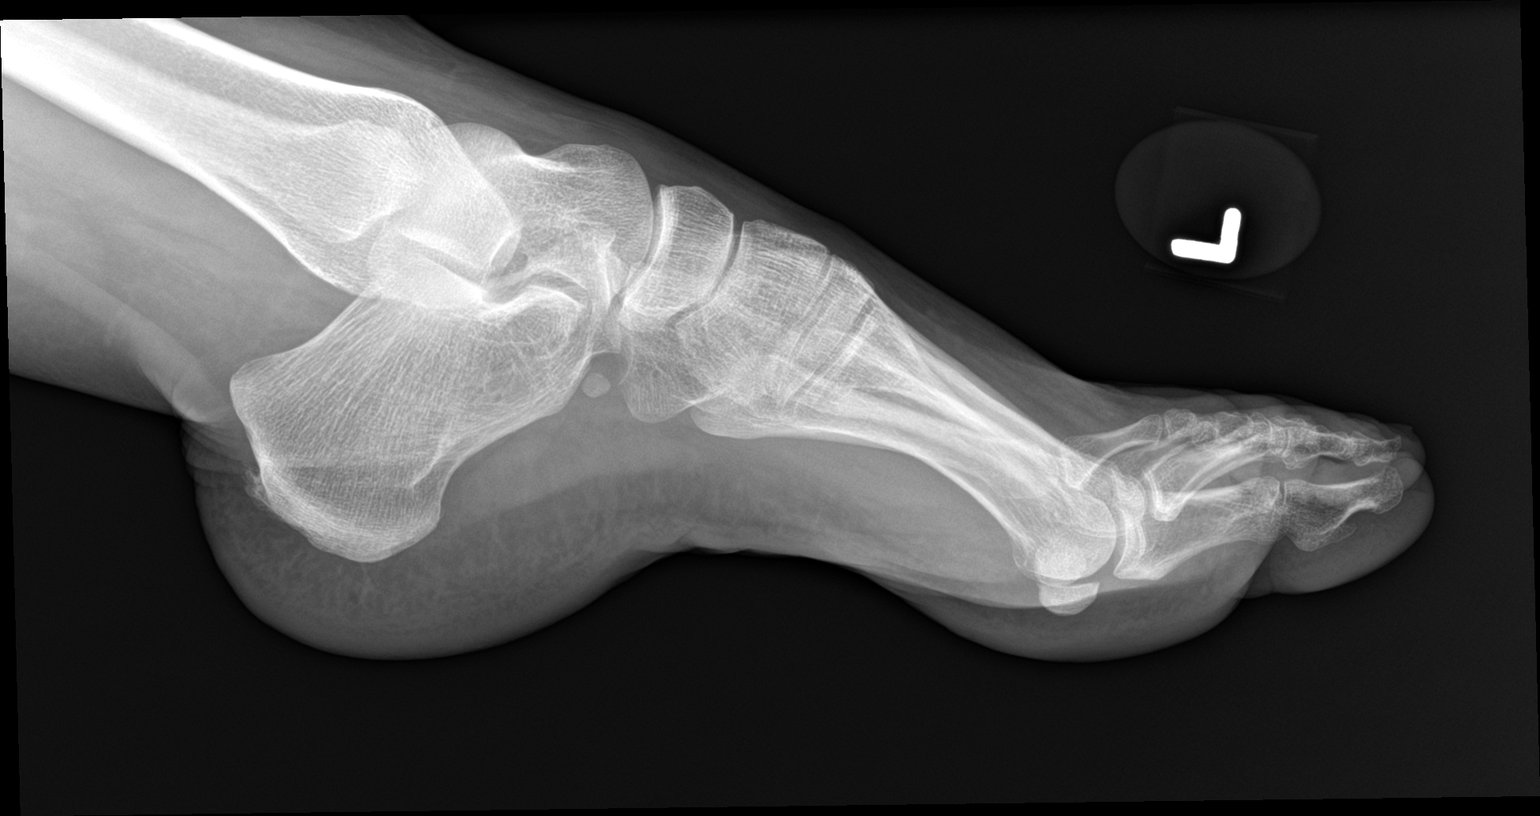

[3 of 3 positions shown; findings below may reference images not displayed]

FINDINGS: No acute fracture or dislocation is noted. There is a vague density
identified in the soft tissues of the heel inferiorly approximately
1 cm deep to the skin surface. It is uncertain whether this
corresponds to the puncture wound but may represent Kiri Jim
fragment.
IMPRESSION: Questionable changes in the heel which may represent a small foreign
body. Would is typically radiolucent on x-ray. Correlation with
physical exam is recommended.

## 2020-03-31 ENCOUNTER — Emergency Department (HOSPITAL_COMMUNITY)
Admission: EM | Admit: 2020-03-31 | Discharge: 2020-03-31 | Disposition: A | Discharge status: Home / Self Care | Payer: Managed Care, Other (non HMO) | Attending: Emergency Medicine | Admitting: Emergency Medicine

## 2020-03-31 ENCOUNTER — Other Ambulatory Visit: Payer: Self-pay

## 2020-03-31 ENCOUNTER — Encounter (HOSPITAL_COMMUNITY): Payer: Self-pay

## 2020-03-31 DIAGNOSIS — H60501 Unspecified acute noninfective otitis externa, right ear: Secondary | ICD-10-CM

## 2020-03-31 DIAGNOSIS — H6091 Unspecified otitis externa, right ear: Secondary | ICD-10-CM | POA: Insufficient documentation

## 2020-03-31 MED ORDER — CIPROFLOXACIN-DEXAMETHASONE 0.3-0.1 % OT SUSP: 4.0000 [drp] | Freq: Two times a day (BID) | OTIC | 0 refills | Status: DC

## 2020-03-31 NOTE — ED Triage Notes (Signed)
Patient c/o right ear pain x 3 days

## 2020-03-31 NOTE — ED Provider Notes (Signed)
[facility]-EMERGENCY DEPT Provider Note   CSN: [id number] Arrival date & time: 03/31/20  1615     History Chief Complaint  Patient presents with  . Otalgia    Tara May is a 44 y.o. female who presents with right ear pain.  Patient states that for the past 3 days she has had constant, gradually worsening right ear pain with associated drainage.  The pain is worse when she pushes the front of the ear.  She denies fever, chills, congestion, runny nose, sore throat.  Symptoms she has some mild pain when she swallows and a slight headache but nothing significant.  She has not been swimming.  She does use Q-tips to clean out the ear occasionally.  HPI     Past Medical History:  Diagnosis Date  . Hep C w/o coma, chronic [facility])     Patient Active Problem List   Diagnosis Date Noted  . Acute otitis externa of right ear 12/18/2017  . Eustachian tube dysfunction, bilateral 12/18/2017    Past Surgical History:  Procedure Laterality Date  . TUBAL LIGATION       OB History   No obstetric history on file.     Family History  Adopted: Yes    Social History   Tobacco Use  . Smoking status: Never Smoker  . Smokeless tobacco: Never Used  Substance Use Topics  . Alcohol use: No  . Drug use: No    Home Medications Prior to Admission medications   Medication Sig Start Date End Date Taking? Authorizing Provider  HYDROcodone-acetaminophen (NORCO) 5-325 MG tablet Take 1 tablet by mouth every 4 (four) hours as needed for moderate pain. 08/12/18   Dione Booze, MD  methocarbamol (ROBAXIN) 500 MG tablet Take 1 tablet (500 mg total) by mouth every 8 (eight) hours as needed. Patient taking differently: Take 500 mg by mouth every 8 (eight) hours as needed for muscle spasms.  08/08/18   Petrucelli, Samantha R, PA-C  naproxen (NAPROSYN) 500 MG tablet Take 1 tablet (500 mg total) by mouth 2 (two) times daily. 08/08/18   Petrucelli, Pleas Koch, PA-C    Allergies     Patient has no known allergies.  Review of Systems   Review of Systems  Constitutional: Negative for fever.  HENT: Positive for ear discharge and ear pain. Negative for congestion, rhinorrhea, sinus pain and sore throat.     Physical Exam Updated Vital Signs BP (!) 137/92 (BP Location: Right Arm)   Pulse 96   Temp 97.9 F (36.6 C) (Oral)   Resp 16   Ht 5' (1.524 m)   Wt 86.2 kg   LMP 03/14/2020 (Approximate)   SpO2 100%   BMI 37.11 kg/m   Physical Exam Vitals and nursing note reviewed.  Constitutional:      General: She is not in acute distress.    Appearance: Normal appearance. She is well-developed. She is not ill-appearing.  HENT:     Head: Normocephalic and atraumatic.     Right Ear: Hearing and external ear normal. Drainage and tenderness present.     Left Ear: Hearing, tympanic membrane, ear canal and external ear normal.     Ears:     Comments: Unable to fully visualize the TM due to swelling and pain Eyes:     General: No scleral icterus.       Right eye: No discharge.        Left eye: No discharge.     Conjunctiva/sclera: Conjunctivae  normal.     Pupils: Pupils are equal, round, and reactive to light.  Cardiovascular:     Rate and Rhythm: Normal rate.  Pulmonary:     Effort: Pulmonary effort is normal. No respiratory distress.  Abdominal:     General: There is no distension.  Musculoskeletal:     Cervical back: Normal range of motion.  Skin:    General: Skin is warm and dry.  Neurological:     Mental Status: She is alert and oriented to person, place, and time.  Psychiatric:        Behavior: Behavior normal.     ED Results / Procedures / Treatments   Labs (all labs ordered are listed, but only abnormal results are displayed) Labs Reviewed - No data to display  EKG None  Radiology No results found.  Procedures Procedures (including critical care time)  Medications Ordered in ED Medications - No data to display  ED Course  I have  reviewed the triage vital signs and the nursing notes.  Pertinent labs & imaging results that were available during my care of the patient were reviewed by me and considered in my medical decision making (see chart for details).  44 year old female presents with right ear pain and drainage consistent with an otitis externa.  I was unable to fully visualize the TM due to swelling and pain.  We will treat for outer ear infection with Ciprodex.  Her vital signs are reassuring she is overall well-appearing.  She denies any allergy or significant infectious symptoms.  Return precautions given  MDM Rules/Calculators/A&P                       Final Clinical Impression(s) / ED Diagnoses Final diagnoses:  Acute otitis externa of right ear, unspecified type    Rx / DC Orders ED Discharge Orders    None       Recardo Evangelist, PA-C 03/31/20 1707    Tegeler, Gwenyth Allegra, MD 04/01/20 [phone]

## 2020-03-31 NOTE — Discharge Instructions (Signed)
Place 4 drops in the right ear twice daily for 7 days Take Tylenol or Ibuprofen for pain Please return if you are worsening

## 2020-07-12 ENCOUNTER — Other Ambulatory Visit: Payer: Self-pay

## 2020-07-12 ENCOUNTER — Emergency Department (HOSPITAL_COMMUNITY)
Admission: EM | Admit: 2020-07-12 | Discharge: 2020-07-12 | Disposition: A | Discharge status: Home / Self Care | Payer: Self-pay | Attending: Emergency Medicine | Admitting: Emergency Medicine

## 2020-07-12 ENCOUNTER — Encounter (HOSPITAL_COMMUNITY): Payer: Self-pay

## 2020-07-12 DIAGNOSIS — N926 Irregular menstruation, unspecified: Secondary | ICD-10-CM

## 2020-07-12 DIAGNOSIS — N912 Amenorrhea, unspecified: Secondary | ICD-10-CM | POA: Insufficient documentation

## 2020-07-12 DIAGNOSIS — R05 Cough: Secondary | ICD-10-CM | POA: Insufficient documentation

## 2020-07-12 DIAGNOSIS — Z9851 Tubal ligation status: Secondary | ICD-10-CM | POA: Insufficient documentation

## 2020-07-12 LAB — PREGNANCY, URINE: Preg Test, Ur: NEGATIVE

## 2020-07-12 NOTE — Discharge Instructions (Addendum)
Your pregnancy test today was negative.  Please follow-up with the women's outpatient clinic for further evaluation.  Return to the ER if your symptoms worsen.

## 2020-07-12 NOTE — ED Notes (Signed)
Labeled urine specimen and culture sent to lab. [facility] 

## 2020-07-12 NOTE — ED Triage Notes (Signed)
Patient reports hot flashed and missed her period this month. Patient reports her tubes were tied 20 years ago.   Pt denies abdominal pain   Also report coughing up mucuos morning and some weight gain.   A/Ox4 Ambulatory in triage.

## 2020-07-12 NOTE — ED Provider Notes (Signed)
[facility]-EMERGENCY DEPT Provider Note   CSN: [id number] Arrival date & time: 07/12/20  1441     History No chief complaint on file.   BARBARA AHART is a 44 y.o. female.  HPI 44 year old female with dry , missed period and hot flashes over the last few days.  Patient states that she had a tubal ligation 20 years ago and has never menstrual period.  However this month she has not gotten her period.  She also reports intermittent periods of feeling extremely hot.  She denies any abdominal pain, vaginal discharge, bleeding.  She states that this morning she also had a cough with some clear mucus.  She has no fevers or chills.  No nausea or vomiting.  She is sexually active with one partner over the last 5 years.  They do not use protection.  She has not taken a home pregnancy test, but is concerned that she could potentially be pregnant in her tied tubes.  She is not insured and does not have a PCP or OB/GYN.    Past Medical History:  Diagnosis Date  . Hep C w/o coma, chronic [facility])     Patient Active Problem List   Diagnosis Date Noted  . Acute otitis externa of right ear 12/18/2017  . Eustachian tube dysfunction, bilateral 12/18/2017    Past Surgical History:  Procedure Laterality Date  . TUBAL LIGATION       OB History   No obstetric history on file.     Family History  Adopted: Yes    Social History   Tobacco Use  . Smoking status: Never Smoker  . Smokeless tobacco: Never Used  Vaping Use  . Vaping Use: Never used  Substance Use Topics  . Alcohol use: No  . Drug use: No    Home Medications Prior to Admission medications   Medication Sig Start Date End Date Taking? Authorizing Provider  ciprofloxacin-dexamethasone (CIPRODEX) OTIC suspension Place 4 drops into the right ear 2 (two) times daily. 03/31/20   Bethel Born, PA-C  HYDROcodone-acetaminophen (NORCO) 5-325 MG tablet Take 1 tablet by mouth every 4 (four) hours as needed for  moderate pain. 08/12/18   Dione Booze, MD  methocarbamol (ROBAXIN) 500 MG tablet Take 1 tablet (500 mg total) by mouth every 8 (eight) hours as needed. Patient taking differently: Take 500 mg by mouth every 8 (eight) hours as needed for muscle spasms.  08/08/18   Petrucelli, Samantha R, PA-C  naproxen (NAPROSYN) 500 MG tablet Take 1 tablet (500 mg total) by mouth 2 (two) times daily. 08/08/18   Petrucelli, Pleas Koch, PA-C    Allergies    Patient has no known allergies.  Review of Systems   Review of Systems  Constitutional: Negative for chills and fever.  HENT: Negative for ear pain and sore throat.   Eyes: Negative for pain and visual disturbance.  Respiratory: Positive for cough. Negative for shortness of breath.   Cardiovascular: Negative for chest pain and palpitations.  Gastrointestinal: Negative for abdominal pain and vomiting.  Genitourinary: Negative for dysuria and hematuria.  Musculoskeletal: Negative for arthralgias and back pain.  Skin: Negative for color change and rash.  Neurological: Negative for seizures and syncope.  All other systems reviewed and are negative.   Physical Exam Updated Vital Signs BP (!) 145/94   Pulse 87   Temp 98.4 F (36.9 C) (Oral)   Resp 20   LMP 05/29/2020   SpO2 99%   Physical Exam Vitals  and nursing note reviewed.  Constitutional:      General: She is not in acute distress.    Appearance: Normal appearance. She is well-developed. She is obese. She is not ill-appearing, toxic-appearing or diaphoretic.  HENT:     Head: Normocephalic and atraumatic.  Eyes:     Conjunctiva/sclera: Conjunctivae normal.     Pupils: Pupils are equal, round, and reactive to light.  Cardiovascular:     Rate and Rhythm: Normal rate and regular rhythm.     Heart sounds: No murmur heard.   Pulmonary:     Effort: Pulmonary effort is normal. No respiratory distress.     Breath sounds: Normal breath sounds.  Abdominal:     General: Abdomen is flat.      Palpations: Abdomen is soft.     Tenderness: There is no abdominal tenderness.  Musculoskeletal:        General: No tenderness or deformity. Normal range of motion.     Cervical back: Neck supple.  Skin:    General: Skin is warm and dry.     Capillary Refill: Capillary refill takes less than 2 seconds.  Neurological:     General: No focal deficit present.     Mental Status: She is alert and oriented to person, place, and time.     Sensory: No sensory deficit.     Motor: No weakness.     ED Results / Procedures / Treatments   Labs (all labs ordered are listed, but only abnormal results are displayed) Labs Reviewed  PREGNANCY, URINE    EKG None  Radiology No results found.  Procedures Procedures (including critical care time)  Medications Ordered in ED Medications - No data to display  ED Course  I have reviewed the triage vital signs and the nursing notes.  Pertinent labs & imaging results that were available during my care of the patient were reviewed by me and considered in my medical decision making (see chart for details).    MDM Rules/Calculators/A&P                         Patient with missed period this month, hot flashes and a dry cough which started this morning. On presentation, the patient is alert and oriented, nontoxic-appearing, no acute distress.  Abdomen soft and nontender on exam, lung sounds clear.  She denies any fevers or chills.  No nausea or vomiting.  States she is concerned that she could potentially be pregnant and her "tubes".  She has not taken a pregnancy test.  She denies any abdominal pain.  I do not think she has any evidence of pneumonia, as the cough started today, she has no fevers or chills.  She states that she has allergies and can sometimes get this clear cough.  She has been vaccinated for Covid.  Do not think she has an ectopic pregnancy as her pregnancy test is negative.  Concern for ovarian torsion, PID, STD low as again the patient  does not have any abdominal complaints.  I referred her to the women's clinic for further evaluation.  Suspect her symptoms are secondary to premenopause or menopause.  All the patient's questions have been answered to her satisfaction, she voices understanding is agreeable to this plan.  At this stage in the ED course, the patient is medically screened and stable for discharge.  Final Clinical Impression(s) / ED Diagnoses Final diagnoses:  Missed period    Rx / DC Orders ED  Discharge Orders    None       Leone Brand 07/12/20 1826    Jacalyn Lefevre, MD 07/12/20 1943

## 2020-12-22 ENCOUNTER — Ambulatory Visit
Admission: EM | Admit: 2020-12-22 | Discharge: 2020-12-22 | Disposition: A | Discharge status: Home / Self Care | Payer: BC Managed Care – PPO | Attending: Emergency Medicine | Admitting: Emergency Medicine

## 2020-12-22 ENCOUNTER — Other Ambulatory Visit: Payer: Self-pay

## 2020-12-22 ENCOUNTER — Encounter: Payer: Self-pay | Admitting: Emergency Medicine

## 2020-12-22 DIAGNOSIS — R197 Diarrhea, unspecified: Secondary | ICD-10-CM | POA: Diagnosis not present

## 2020-12-22 DIAGNOSIS — R112 Nausea with vomiting, unspecified: Secondary | ICD-10-CM

## 2020-12-22 MED ORDER — ONDANSETRON 4 MG PO TBDP: 4.0000 mg | ORAL_TABLET | Freq: Three times a day (TID) | ORAL | 0 refills | Status: DC | PRN

## 2020-12-22 NOTE — ED Triage Notes (Signed)
Patient c/o nausea, diarrhea, fever, and headache x 1 day.   Patient states her temperature was 99.50F at home.  Patient endorses 4-5 episodes of diarrhea within the past 12 hours.   Patient has taken Tylenol for headache.

## 2020-12-22 NOTE — ED Provider Notes (Signed)
[facility]    CSN: [id number] Arrival date & time: 12/22/20  1047      History   Chief Complaint Chief Complaint  Patient presents with  . Diarrhea  . Nausea  . Headache    HPI Tara May is a 45 y.o. female presenting today for evaluation of fever headache and nausea and diarrhea.  Reports 4-5 episodes of diarrhea in the past 12 hours.  Using Tylenol for headache.  Reports possible Covid exposures recently.  Denies URI symptoms.  Denies abdominal pain.  Denies dizziness or lightheadedness.  HPI  Past Medical History:  Diagnosis Date  . Hep C w/o coma, chronic [facility])     Patient Active Problem List   Diagnosis Date Noted  . Acute otitis externa of right ear 12/18/2017  . Eustachian tube dysfunction, bilateral 12/18/2017    Past Surgical History:  Procedure Laterality Date  . TUBAL LIGATION      OB History   No obstetric history on file.      Home Medications    Prior to Admission medications   Medication Sig Start Date End Date Taking? Authorizing Provider  ondansetron (ZOFRAN ODT) 4 MG disintegrating tablet Take 1 tablet (4 mg total) by mouth every 8 (eight) hours as needed for nausea or vomiting. 12/22/20  Yes Berklie Dethlefs C, PA-C  ciprofloxacin-dexamethasone (CIPRODEX) OTIC suspension Place 4 drops into the right ear 2 (two) times daily. 03/31/20   Bethel Born, PA-C  HYDROcodone-acetaminophen (NORCO) 5-325 MG tablet Take 1 tablet by mouth every 4 (four) hours as needed for moderate pain. 08/12/18   Dione Booze, MD  methocarbamol (ROBAXIN) 500 MG tablet Take 1 tablet (500 mg total) by mouth every 8 (eight) hours as needed. Patient taking differently: Take 500 mg by mouth every 8 (eight) hours as needed for muscle spasms. 08/08/18   Petrucelli, Samantha R, PA-C  naproxen (NAPROSYN) 500 MG tablet Take 1 tablet (500 mg total) by mouth 2 (two) times daily. 08/08/18   Petrucelli, Pleas Koch, PA-C    Family History Family History  Adopted:  Yes    Social History Social History   Tobacco Use  . Smoking status: Never Smoker  . Smokeless tobacco: Never Used  Vaping Use  . Vaping Use: Never used  Substance Use Topics  . Alcohol use: No  . Drug use: No     Allergies   Patient has no known allergies.   Review of Systems Review of Systems  Constitutional: Positive for fatigue and fever. Negative for activity change, appetite change and chills.  HENT: Negative for congestion, ear pain, rhinorrhea, sinus pressure, sore throat and trouble swallowing.   Eyes: Negative for discharge and redness.  Respiratory: Negative for cough, chest tightness and shortness of breath.   Cardiovascular: Negative for chest pain.  Gastrointestinal: Positive for diarrhea, nausea and vomiting. Negative for abdominal pain.  Musculoskeletal: Negative for myalgias.  Skin: Negative for rash.  Neurological: Positive for headaches. Negative for dizziness and light-headedness.     Physical Exam Triage Vital Signs ED Triage Vitals  Enc Vitals Group     BP 12/22/20 1240 (!) 127/95     Pulse Rate 12/22/20 1240 75     Resp 12/22/20 1240 16     Temp 12/22/20 1240 98 F (36.7 C)     Temp Source 12/22/20 1240 Oral     SpO2 12/22/20 1240 98 %     Weight 12/22/20 1239 195 lb (88.5 kg)     Height  12/22/20 1239 5' (1.524 m)     Head Circumference --      Peak Flow --      Pain Score 12/22/20 1238 4     Pain Loc --      Pain Edu? --      Excl. in GC? --    No data found.  Updated Vital Signs BP (!) 127/95 (BP Location: Right Arm)   Pulse 75   Temp 98 F (36.7 C) (Oral)   Resp 16   Ht 5' (1.524 m)   Wt 195 lb (88.5 kg)   LMP 12/21/2020   SpO2 98%   BMI 38.08 kg/m   Visual Acuity Right Eye Distance:   Left Eye Distance:   Bilateral Distance:    Right Eye Near:   Left Eye Near:    Bilateral Near:     Physical Exam Vitals and nursing note reviewed.  Constitutional:      Appearance: She is well-developed and well-nourished.      Comments: No acute distress  HENT:     Head: Normocephalic and atraumatic.     Ears:     Comments: Bilateral ears without tenderness to palpation of external auricle, tragus and mastoid, EAC's without erythema or swelling, TM's with good bony landmarks and cone of light. Non erythematous.      Nose: Nose normal.     Mouth/Throat:     Comments: Oral mucosa pink and moist, no tonsillar enlargement or exudate. Posterior pharynx patent and nonerythematous, no uvula deviation or swelling. Normal phonation. Eyes:     Conjunctiva/sclera: Conjunctivae normal.  Cardiovascular:     Rate and Rhythm: Normal rate.  Pulmonary:     Effort: Pulmonary effort is normal. No respiratory distress.     Comments: Breathing comfortably at rest, CTABL, no wheezing, rales or other adventitious sounds auscultated Abdominal:     General: There is no distension.     Comments: Soft, nondistended, nontender light deep palpation throughout abdomen  Musculoskeletal:        General: Normal range of motion.     Cervical back: Neck supple.  Skin:    General: Skin is warm and dry.  Neurological:     Mental Status: She is alert and oriented to person, place, and time.  Psychiatric:        Mood and Affect: Mood and affect normal.      UC Treatments / Results  Labs (all labs ordered are listed, but only abnormal results are displayed) Labs Reviewed  NOVEL CORONAVIRUS, NAA    EKG   Radiology No results found.  Procedures Procedures (including critical care time)  Medications Ordered in UC Medications - No data to display  Initial Impression / Assessment and Plan / UC Course  I have reviewed the triage vital signs and the nursing notes.  Pertinent labs & imaging results that were available during my care of the patient were reviewed by me and considered in my medical decision making (see chart for details).     Covid test pending, suspect viral gastroenteritis and recommending symptomatic and  supportive care rest and fluids.  Continue to monitor, no abdominal pain or tenderness, low suspicion of underlying abdominal emergency.  Discussed strict return precautions. Patient verbalized understanding and is agreeable with plan.  Final Clinical Impressions(s) / UC Diagnoses   Final diagnoses:  Nausea vomiting and diarrhea     Discharge Instructions     Covid test pending, monitor my chart for results Tylenol or ibuprofen  for any fevers body aches headaches Zofran for nausea push fluids May use Imodium or Pepto-Bismol over-the-counter if diarrhea extremely frequent follow-up if not improving or worsening    ED Prescriptions    Medication Sig Dispense Auth. Provider   ondansetron (ZOFRAN ODT) 4 MG disintegrating tablet Take 1 tablet (4 mg total) by mouth every 8 (eight) hours as needed for nausea or vomiting. 20 tablet Garren Greenman, [place] C, PA-C     PDMP not reviewed this encounter.   Janith Lima, PA-C 12/22/20 1346

## 2020-12-22 NOTE — Discharge Instructions (Addendum)
Covid test pending, monitor my chart for results Tylenol or ibuprofen for any fevers body aches headaches Zofran for nausea push fluids May use Imodium or Pepto-Bismol over-the-counter if diarrhea extremely frequent follow-up if not improving or worsening

## 2020-12-26 LAB — NOVEL CORONAVIRUS, NAA: SARS-CoV-2, NAA: NOT DETECTED

## 2022-06-19 ENCOUNTER — Emergency Department (HOSPITAL_COMMUNITY): Payer: PRIVATE HEALTH INSURANCE

## 2022-06-19 ENCOUNTER — Encounter (HOSPITAL_COMMUNITY): Payer: Self-pay

## 2022-06-19 ENCOUNTER — Emergency Department (HOSPITAL_COMMUNITY)
Admission: EM | Admit: 2022-06-19 | Discharge: 2022-06-19 | Disposition: A | Discharge status: Home / Self Care | Payer: PRIVATE HEALTH INSURANCE | Attending: Emergency Medicine | Admitting: Emergency Medicine

## 2022-06-19 DIAGNOSIS — M7021 Olecranon bursitis, right elbow: Secondary | ICD-10-CM | POA: Diagnosis not present

## 2022-06-19 DIAGNOSIS — M25521 Pain in right elbow: Secondary | ICD-10-CM | POA: Diagnosis present

## 2022-06-19 DIAGNOSIS — Y9389 Activity, other specified: Secondary | ICD-10-CM | POA: Insufficient documentation

## 2022-06-19 MED ORDER — KETOROLAC TROMETHAMINE 15 MG/ML IJ SOLN
15.0000 mg | Freq: Once | INTRAMUSCULAR | Status: AC
Start: 2022-06-19 — End: 2022-06-19
  Administered 2022-06-19: 15 mg via INTRAMUSCULAR
  Filled 2022-06-19: qty 1

## 2022-06-19 NOTE — ED Provider Notes (Signed)
[facility]  HOSPITAL-EMERGENCY DEPT Provider Note   CSN: [id number] Arrival date & time: 06/19/22  0747     History  Chief Complaint  Patient presents with   Elbow Pain    Tara May is a 46 y.o. female.  46 year old female with prior medical history as detailed below presents for evaluation.  Patient complains of pain to the right elbow.  This is been present for the last 1 to 2 weeks.  Patient denies any specific injury.  Patient's pain is primarily located over the right olecranon process.  She denies fever.  She denies decreased range of motion.  She denies numbness or tingling in the extremity.  She has not taken anything at home for the pain.  The history is provided by the patient and medical records.       Home Medications Prior to Admission medications   Medication Sig Start Date End Date Taking? Authorizing Provider  ciprofloxacin-dexamethasone (CIPRODEX) OTIC suspension Place 4 drops into the right ear 2 (two) times daily. 03/31/20   Bethel Born, PA-C  HYDROcodone-acetaminophen (NORCO) 5-325 MG tablet Take 1 tablet by mouth every 4 (four) hours as needed for moderate pain. 08/12/18   Dione Booze, MD  methocarbamol (ROBAXIN) 500 MG tablet Take 1 tablet (500 mg total) by mouth every 8 (eight) hours as needed. Patient taking differently: Take 500 mg by mouth every 8 (eight) hours as needed for muscle spasms. 08/08/18   Petrucelli, Samantha R, PA-C  naproxen (NAPROSYN) 500 MG tablet Take 1 tablet (500 mg total) by mouth 2 (two) times daily. 08/08/18   Petrucelli, Samantha R, PA-C  ondansetron (ZOFRAN ODT) 4 MG disintegrating tablet Take 1 tablet (4 mg total) by mouth every 8 (eight) hours as needed for nausea or vomiting. 12/22/20   Wieters, Hallie C, PA-C      Allergies    Patient has no known allergies.    Review of Systems   Review of Systems  All other systems reviewed and are negative.   Physical Exam Updated Vital Signs BP (!) 146/91  (BP Location: Left Arm)   Pulse 77   Temp 97.6 F (36.4 C) (Oral)   Resp 18   LMP 05/30/2022 (Approximate)   SpO2 99%  Physical Exam Vitals and nursing note reviewed.  Constitutional:      General: She is not in acute distress.    Appearance: Normal appearance. She is well-developed.  HENT:     Head: Normocephalic and atraumatic.  Eyes:     Conjunctiva/sclera: Conjunctivae normal.     Pupils: Pupils are equal, round, and reactive to light.  Cardiovascular:     Rate and Rhythm: Normal rate.  Pulmonary:     Effort: Pulmonary effort is normal. No respiratory distress.  Abdominal:     General: There is no distension.     Tenderness: There is no abdominal tenderness.  Musculoskeletal:        General: No deformity. Normal range of motion.     Cervical back: Normal range of motion and neck supple.     Comments: Right elbow with full active range of motion.  Mild tenderness elicited with palpation over the olecranon process.  Exam is consistent with mild olecranon bursitis.  Skin:    General: Skin is warm and dry.  Neurological:     General: No focal deficit present.     Mental Status: She is alert and oriented to person, place, and time.     ED Results / Procedures /  Treatments   Labs (all labs ordered are listed, but only abnormal results are displayed) Labs Reviewed - No data to display  EKG None  Radiology DG Elbow Complete Right  Result Date: 06/19/2022 CLINICAL DATA:  Right elbow pain for 1 week.  No trauma. EXAM: RIGHT ELBOW - COMPLETE 3+ VIEW COMPARISON:  06/24/2017 FINDINGS: No acute fracture or dislocation.  No joint effusion. IMPRESSION: No acute osseous abnormality. Electronically Signed   By: Jeronimo Greaves M.D.   On: 06/19/2022 08:22    Procedures Procedures    Medications Ordered in ED Medications  ketorolac (TORADOL) 15 MG/ML injection 15 mg (15 mg Intramuscular Given 06/19/22 2841)    ED Course/ Medical Decision Making/ A&P                            Medical Decision Making Amount and/or Complexity of Data Reviewed Radiology: ordered.  Risk Prescription drug management.    Medical Screen Complete  This patient presented to the ED with complaint of right elbow pain.  This complaint involves an extensive number of treatment options. The initial differential diagnosis includes, but is not limited to, bursitis versus strain, fracture, etc  This presentation is: Acute, Self-Limited, Previously Undiagnosed, and Uncertain Prognosis  Patient is present with complaint of right elbow pain.  Symptoms present for a week and a half.  History and exam are consistent with mild olecranon bursitis of the right elbow  Imaging is without significant abnormality.  Patient educated about how to manage her bursitis in the outpatient setting.  Importance of close follow-up stressed.  Strict return precautions given and understood. Additional history obtained:  External records from outside sources obtained and reviewed including prior ED visits and prior Inpatient records.    Imaging Studies ordered:  I ordered imaging studies including right elbow plain film I independently visualized and interpreted obtained imaging which showed NAD I agree with the radiologist interpretation.   Medicines ordered:  I ordered medication including Toradol for pain Reevaluation of the patient after these medicines showed that the patient: improved   Problem List / ED Course:  Right elbow olecranon bursitis   Reevaluation:  After the interventions noted above, I reevaluated the patient and found that they have: improved   Disposition:  After consideration of the diagnostic results and the patients response to treatment, I feel that the patent would benefit from close outpatient follow up.          Final Clinical Impression(s) / ED Diagnoses Final diagnoses:  Olecranon bursitis of right elbow    Rx / DC Orders ED Discharge Orders      None         Wynetta Fines, MD 06/19/22 [phone]

## 2022-06-19 NOTE — Discharge Instructions (Signed)
Return for any problem.  ?

## 2022-06-19 NOTE — ED Triage Notes (Signed)
Pt presents with c/o right elbow pain for approx one week. 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

## 2022-06-19 NOTE — ED Notes (Signed)
ED Provider at bedside. 

## 2022-07-07 ENCOUNTER — Emergency Department (HOSPITAL_COMMUNITY)
Admission: EM | Admit: 2022-07-07 | Discharge: 2022-07-07 | Disposition: A | Discharge status: Home / Self Care | Payer: PRIVATE HEALTH INSURANCE | Attending: Emergency Medicine | Admitting: Emergency Medicine

## 2022-07-07 ENCOUNTER — Encounter (HOSPITAL_COMMUNITY): Payer: Self-pay

## 2022-07-07 ENCOUNTER — Other Ambulatory Visit: Payer: Self-pay

## 2022-07-07 DIAGNOSIS — H60311 Diffuse otitis externa, right ear: Secondary | ICD-10-CM | POA: Insufficient documentation

## 2022-07-07 DIAGNOSIS — H66001 Acute suppurative otitis media without spontaneous rupture of ear drum, right ear: Secondary | ICD-10-CM | POA: Insufficient documentation

## 2022-07-07 DIAGNOSIS — H9201 Otalgia, right ear: Secondary | ICD-10-CM | POA: Diagnosis present

## 2022-07-07 MED ORDER — AMOXICILLIN 500 MG PO CAPS: 500.0000 mg | ORAL_CAPSULE | Freq: Three times a day (TID) | ORAL | 0 refills | Status: DC

## 2022-07-07 MED ORDER — CIPROFLOXACIN-DEXAMETHASONE 0.3-0.1 % OT SUSP: 4.0000 [drp] | Freq: Two times a day (BID) | OTIC | 0 refills | Status: DC

## 2022-07-07 NOTE — ED Provider Notes (Signed)
[facility]-EMERGENCY DEPT Provider Note   CSN: [id number] Arrival date & time: 07/07/22  0820     History  Chief Complaint  Patient presents with   Otalgia    Tara May is a 46 y.o. female with past medical history significant for previous otitis externa, previous eustachian tube dysfunction who presents with ear drainage, pain from right ear.  Patient reports that has been going on for around 1 week, and is worse when she swallows.  She denies any hearing loss, fever, chills.  She denies any traumatic injury to the ear. Patient cannot describe what the drainage is, but denies seeing any blood.   Otalgia Associated symptoms: ear discharge        Home Medications Prior to Admission medications   Medication Sig Start Date End Date Taking? Authorizing Provider  amoxicillin (AMOXIL) 500 MG capsule Take 1 capsule (500 mg total) by mouth 3 (three) times daily. 07/07/22  Yes Rip Hawes H, PA-C  ciprofloxacin-dexamethasone (CIPRODEX) OTIC suspension Place 4 drops into the right ear 2 (two) times daily. For 7 days 07/07/22  Yes Tranesha Lessner H, PA-C  HYDROcodone-acetaminophen (NORCO) 5-325 MG tablet Take 1 tablet by mouth every 4 (four) hours as needed for moderate pain. 08/12/18   Dione Booze, MD  methocarbamol (ROBAXIN) 500 MG tablet Take 1 tablet (500 mg total) by mouth every 8 (eight) hours as needed. Patient taking differently: Take 500 mg by mouth every 8 (eight) hours as needed for muscle spasms. 08/08/18   Petrucelli, Samantha R, PA-C  naproxen (NAPROSYN) 500 MG tablet Take 1 tablet (500 mg total) by mouth 2 (two) times daily. 08/08/18   Petrucelli, Samantha R, PA-C  ondansetron (ZOFRAN ODT) 4 MG disintegrating tablet Take 1 tablet (4 mg total) by mouth every 8 (eight) hours as needed for nausea or vomiting. 12/22/20   Wieters, Hallie C, PA-C      Allergies    Patient has no known allergies.    Review of Systems   Review of Systems  HENT:   Positive for ear discharge and ear pain.   All other systems reviewed and are negative.   Physical Exam Updated Vital Signs BP 133/90 (BP Location: Right Arm)   Pulse 75   Temp 98.1 F (36.7 C) (Oral)   Resp 16   Ht 5' (1.524 m)   Wt 82.6 kg   LMP 06/28/2022 (Approximate)   SpO2 96%   BMI 35.54 kg/m  Physical Exam Vitals and nursing note reviewed.  Constitutional:      General: She is not in acute distress.    Appearance: Normal appearance.  HENT:     Head: Normocephalic and atraumatic.     Comments: Left TM is normal, and external ear normal.  Right TM with some redness, bulging tympanic membrane.  There is also some significant abnormality of right external ear with several crusty lesions, purulence, pain with exam. No evidence of TM perforation Eyes:     General:        Right eye: No discharge.        Left eye: No discharge.  Cardiovascular:     Rate and Rhythm: Normal rate and regular rhythm.  Pulmonary:     Effort: Pulmonary effort is normal. No respiratory distress.  Musculoskeletal:        General: No deformity.  Skin:    General: Skin is warm and dry.  Neurological:     Mental Status: She is alert and oriented to person,  place, and time.  Psychiatric:        Mood and Affect: Mood normal.        Behavior: Behavior normal.     ED Results / Procedures / Treatments   Labs (all labs ordered are listed, but only abnormal results are displayed) Labs Reviewed - No data to display  EKG None  Radiology No results found.  Procedures Procedures    Medications Ordered in ED Medications - No data to display  ED Course/ Medical Decision Making/ A&P                           Medical Decision Making  Patient with right ear pain, drainage.  My emergent differential diagnosis includes acute otitis externa, otitis media, malignant otitis externa, mastoiditis, other ENT infection or abnormality, tympanic membrane rupture versus other.  On exam patient with clear  signs of otitis externa, as well as questionable signs of otitis media.  Discussed with patient and performed shared decision making of treating just otitis externa and watching and waiting for possible resolution of otitis media versus treating both.  Patient reports she does not have easy follow-up and would prefer to treat both presumptively.  Has her TM does appear somewhat red and bulging I think it is reasonable to treat for both.  We will prescribe Ciprodex as well as amoxicillin.  Encourage recheck, establishing care with PCP, versus return to the emergency department as needed for worsening symptoms, hearing loss versus other. Final Clinical Impression(s) / ED Diagnoses Final diagnoses:  Acute diffuse otitis externa of right ear  Non-recurrent acute suppurative otitis media of right ear without spontaneous rupture of tympanic membrane    Rx / DC Orders ED Discharge Orders          Ordered    ciprofloxacin-dexamethasone (CIPRODEX) OTIC suspension  2 times daily        07/07/22 0921    amoxicillin (AMOXIL) 500 MG capsule  3 times daily        07/07/22 0921              Tejal Monroy, Edyth Gunnels 07/07/22 3664    Mancel Bale, MD 07/07/22 [phone]

## 2022-07-07 NOTE — Discharge Instructions (Addendum)
Please use Tylenol or ibuprofen for pain.  You may use 600 mg ibuprofen every 6 hours or 1000 mg of Tylenol every 6 hours.  You may choose to alternate between the 2.  This would be most effective.  Not to exceed 4 g of Tylenol within 24 hours.  Not to exceed 3200 mg ibuprofen 24 hours. Keep the ear clean and dry, but I would refrain from using any Q-tip or other products until ear infection completely resolved.  Please follow-up with your primary care doctor to ensure for resolution or return to the emergency department if your symptoms worsen or you develop fever, chills, hearing loss, or worsening drainage.

## 2022-07-07 NOTE — ED Triage Notes (Signed)
Patient c/o right ear pain and drainage x 1 week. Patient states worse when she swallows.

## 2022-10-28 ENCOUNTER — Emergency Department (HOSPITAL_COMMUNITY)
Admission: EM | Admit: 2022-10-28 | Discharge: 2022-10-29 | Disposition: A | Discharge status: Home / Self Care | Payer: Commercial Managed Care - HMO | Attending: Emergency Medicine | Admitting: Emergency Medicine

## 2022-10-28 ENCOUNTER — Other Ambulatory Visit: Payer: Self-pay

## 2022-10-28 ENCOUNTER — Encounter (HOSPITAL_COMMUNITY): Payer: Self-pay | Admitting: Emergency Medicine

## 2022-10-28 ENCOUNTER — Emergency Department (HOSPITAL_COMMUNITY): Payer: Commercial Managed Care - HMO

## 2022-10-28 DIAGNOSIS — L03011 Cellulitis of right finger: Secondary | ICD-10-CM | POA: Diagnosis not present

## 2022-10-28 DIAGNOSIS — M79644 Pain in right finger(s): Secondary | ICD-10-CM | POA: Diagnosis present

## 2022-10-28 DIAGNOSIS — I10 Essential (primary) hypertension: Secondary | ICD-10-CM | POA: Insufficient documentation

## 2022-10-28 NOTE — ED Triage Notes (Addendum)
Patient complains of right thumb throbbing and tingling which started last night and has worsened since. She also reports itching. She if unsure if she injured the thumb. Swelling noted and a reported change in the nail. She is concerned for possible infection.

## 2022-10-28 NOTE — ED Provider Triage Note (Signed)
Emergency Medicine Provider Triage Evaluation Note  Tara May , a 46 y.o. female  was evaluated in triage.  Pt complains of right thumb throbbing and tingling sensation started last night.  Reports pain at the tip of the right thumb.  Did not remember if she injured her thumb.  She also noticed mild swelling to the right thumb.  Has not tried anything for pain.  Patient has no other complaints.  Review of Systems  Positive: As above Negative: As above  Physical Exam  BP (!) 148/96   Pulse 75   Temp 98.1 F (36.7 C) (Oral)   Resp 18   Ht 5' (1.524 m)   Wt 89.7 kg   SpO2 94%   BMI 38.61 kg/m  Gen:   Awake, no distress   Resp:  Normal effort  MSK:   Moves extremities without difficulty  Other:    Medical Decision Making  Medically screening exam initiated at 9:37 PM.  Appropriate orders placed.  [name] was informed that the remainder of the evaluation will be completed by another provider, this initial triage assessment does not replace that evaluation, and the importance of remaining in the ED until their evaluation is complete.     Tara May, [place] 10/29/22 [phone]

## 2022-10-29 ENCOUNTER — Encounter (HOSPITAL_COMMUNITY): Payer: Self-pay | Admitting: Emergency Medicine

## 2022-10-29 MED ORDER — SULFAMETHOXAZOLE-TRIMETHOPRIM 800-160 MG PO TABS: 1.0000 | ORAL_TABLET | Freq: Two times a day (BID) | ORAL | 0 refills | Status: AC

## 2022-10-29 MED ORDER — SULFAMETHOXAZOLE-TRIMETHOPRIM 800-160 MG PO TABS
1.0000 | ORAL_TABLET | Freq: Once | ORAL | Status: AC
  Administered 2022-10-29: 1 via ORAL
  Filled 2022-10-29: qty 1

## 2022-10-29 MED ORDER — AMOXICILLIN-POT CLAVULANATE 875-125 MG PO TABS
1.0000 | ORAL_TABLET | Freq: Once | ORAL | Status: AC
Start: 2022-10-29 — End: 2022-10-29
  Administered 2022-10-29: 1 via ORAL
  Filled 2022-10-29: qty 1

## 2022-10-29 NOTE — Discharge Instructions (Signed)
It appears you have an early skin infection in your right thumb.  PCP prescribed antibiotics for the entire course.  It is advised that you stop chewing your nails.  Youay apply ice to area for discomfort; you may take Tylenol or ibuprofen as needed.  Follow-up with your PCP and return to the ER with any severe symptoms.

## 2022-10-29 NOTE — ED Provider Notes (Signed)
[facility]-EMERGENCY DEPT Provider Note   CSN: [id number] Arrival date & time: 10/28/22  2037     History  Chief Complaint  Patient presents with   thumb pain    Tara May is a 46 y.o. female who presents with concern for throbbing pain in the right thumb tip x 24 hours, with mild redness and swelling. Patient chews her nails, states she has done this for a very long time. Denies any recent injury to the finger. No pain in the remainder of the hand or wrist.  Pain improved with tylenol.   I have personally reviewed her medical records. She has hx of Hep C without coma, no medications daily.  HPI     Home Medications Prior to Admission medications   Medication Sig Start Date End Date Taking? Authorizing Provider  sulfamethoxazole-trimethoprim (BACTRIM DS) 800-160 MG tablet Take 1 tablet by mouth 2 (two) times daily for 7 days. 10/29/22 11/05/22 Yes Alexismarie Flaim, Lupe Carney R, PA-C  amoxicillin (AMOXIL) 500 MG capsule Take 1 capsule (500 mg total) by mouth 3 (three) times daily. 07/07/22   Prosperi, Christian H, PA-C  ciprofloxacin-dexamethasone (CIPRODEX) OTIC suspension Place 4 drops into the right ear 2 (two) times daily. For 7 days 07/07/22   Prosperi, Christian H, PA-C  HYDROcodone-acetaminophen (NORCO) 5-325 MG tablet Take 1 tablet by mouth every 4 (four) hours as needed for moderate pain. 08/12/18   Dione Booze, MD  methocarbamol (ROBAXIN) 500 MG tablet Take 1 tablet (500 mg total) by mouth every 8 (eight) hours as needed. Patient taking differently: Take 500 mg by mouth every 8 (eight) hours as needed for muscle spasms. 08/08/18   Petrucelli, Samantha R, PA-C  naproxen (NAPROSYN) 500 MG tablet Take 1 tablet (500 mg total) by mouth 2 (two) times daily. 08/08/18   Petrucelli, Samantha R, PA-C  ondansetron (ZOFRAN ODT) 4 MG disintegrating tablet Take 1 tablet (4 mg total) by mouth every 8 (eight) hours as needed for nausea or vomiting. 12/22/20   Wieters, Hallie  C, PA-C      Allergies    Patient has no known allergies.    Review of Systems   Review of Systems  Musculoskeletal:        Thumb pain, right    Physical Exam Updated Vital Signs BP (!) 177/111   Pulse 73   Temp 98.1 F (36.7 C) (Oral)   Resp 18   Ht 5' (1.524 m)   Wt 89.7 kg   LMP 10/20/2022   SpO2 99%   BMI 38.61 kg/m  Physical Exam Vitals and nursing note reviewed.  Constitutional:      Appearance: She is obese. She is not ill-appearing or toxic-appearing.  HENT:     Head: Normocephalic and atraumatic.  Eyes:     General: No scleral icterus.       Right eye: No discharge.        Left eye: No discharge.     Conjunctiva/sclera: Conjunctivae normal.  Pulmonary:     Effort: Pulmonary effort is normal.  Musculoskeletal:       Hands:     Comments: No TTP over the IP joint, thenar eminence, or remainder of fingers. Normal cap refill and pulses in the hand.   Skin:    General: Skin is warm and dry.     Capillary Refill: Capillary refill takes less than 2 seconds.  Neurological:     General: No focal deficit present.     Mental Status: She is  alert.  Psychiatric:        Mood and Affect: Mood normal.     ED Results / Procedures / Treatments   Labs (all labs ordered are listed, but only abnormal results are displayed) Labs Reviewed - No data to display  EKG None  Radiology DG Finger Thumb Right  Result Date: 10/28/2022 CLINICAL DATA:  Thumb pain starting last night. EXAM: RIGHT THUMB 2+V COMPARISON:  None Available. FINDINGS: Moderate thumb interphalangeal joint space narrowing with mild peripheral degenerative osteophytes. The thumb metacarpophalangeal and carpometacarpal joints appear maintained. No acute fracture or dislocation. 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 Electronically Signed   By: Neita Garnet M.D.   On: 10/28/2022 22:09    Procedures Procedures    Medications Ordered in ED Medications   amoxicillin-clavulanate (AUGMENTIN) 875-125 MG per tablet 1 tablet (1 tablet Oral Given 10/29/22 0422)  sulfamethoxazole-trimethoprim (BACTRIM DS) 800-160 MG per tablet 1 tablet (1 tablet Oral Given 10/29/22 0422)    ED Course/ Medical Decision Making/ A&P                           Medical Decision Making 46 year old female who presents with concern for pain in the distal right thumb x 24 hours.   HTN on intake, VS otherwise normal, neurovascularly intact in the finger. Exam as above.   DDX includes but is not limited to cellulitis, felon, paronychia, fx, dislocation, herpetic whitlow.    Risk Prescription drug management.  Clinical picture most consistent with developing felon / cellulitis, without drainable pocket of purulence in the ED at this time, likely contributed to by chewing her nails. First dose of abx administered in the ED. Will d/c with abx as well, analgesia offered, patient declined. No further workup warranted in the ED at this time. Clinical concern for emergent underlying etiology that would warrant further ED workup or inpatient management is exceedingly low.   Loreal voiced understanding of her medical evaluation and treatment plan. Each of their questions answered to their expressed satisfaction.  Return precautions were given.  Patient is well-appearing, stable, and was discharged in good condition.  This chart was dictated using voice recognition software, Dragon. Despite the best efforts of this provider to proofread and correct errors, errors may still occur which can change documentation meaning. Final Clinical Impression(s) / ED Diagnoses Final diagnoses:  Cellulitis of finger of right hand    Rx / DC Orders ED Discharge Orders          Ordered    sulfamethoxazole-trimethoprim (BACTRIM DS) 800-160 MG tablet  2 times daily        10/29/22 0403              Jaskarn Schweer, Eugene Gavia, PA-C 10/29/22 0427    Nira Conn, MD 10/29/22  [phone]

## 2022-12-22 ENCOUNTER — Other Ambulatory Visit: Payer: Self-pay

## 2022-12-22 ENCOUNTER — Emergency Department (HOSPITAL_COMMUNITY)
Admission: EM | Admit: 2022-12-22 | Discharge: 2022-12-22 | Disposition: A | Discharge status: Home / Self Care | Payer: Commercial Managed Care - HMO | Attending: Emergency Medicine | Admitting: Emergency Medicine

## 2022-12-22 ENCOUNTER — Encounter (HOSPITAL_COMMUNITY): Payer: Self-pay

## 2022-12-22 DIAGNOSIS — B349 Viral infection, unspecified: Secondary | ICD-10-CM | POA: Diagnosis not present

## 2022-12-22 DIAGNOSIS — R070 Pain in throat: Secondary | ICD-10-CM | POA: Diagnosis present

## 2022-12-22 LAB — GROUP A STREP BY PCR: Group A Strep by PCR: NOT DETECTED

## 2022-12-22 MED ORDER — ACETAMINOPHEN 500 MG PO TABS
1000.0000 mg | ORAL_TABLET | Freq: Three times a day (TID) | ORAL | 0 refills | Status: AC | PRN
Start: 2022-12-22 — End: ?

## 2022-12-22 MED ORDER — LIDOCAINE VISCOUS HCL 2 % MT SOLN: 15.0000 mL | OROMUCOSAL | 0 refills | Status: DC | PRN

## 2022-12-22 NOTE — ED Triage Notes (Signed)
Patient c/o sore throat x 3 days. Patient states she has taken OTC med wth no relief.

## 2022-12-22 NOTE — Discharge Instructions (Signed)
You have been evaluated for your symptoms.  You are likely having influenza infection.  Your strep test came back negative.  Take medication prescribed as needed for symptom control.  Follow-up with your doctor for further care.

## 2022-12-22 NOTE — ED Provider Notes (Signed)
[facility] DEPT Provider Note   CSN: [id number] Arrival date & time: 12/22/22  0865     History  Chief Complaint  Patient presents with   Sore Throat    Tara May is a 47 y.o. female.  The history is provided by the patient and medical records. No language interpreter was used.  Sore Throat     47 year old female significant history of hepatitis C, presenting complaint of sore throat.  Patient report for the past 3 days she has had cold symptoms which includes congestion, cough, and now sore throat.  Despite taking over-the-counter medication for symptoms still persist.  She does not endorse any fever or bodyaches.  She did mention several family members recently test positive for the flu.  Patient denies having vomiting or diarrhea.  Home Medications Prior to Admission medications   Medication Sig Start Date End Date Taking? Authorizing Provider  amoxicillin (AMOXIL) 500 MG capsule Take 1 capsule (500 mg total) by mouth 3 (three) times daily. 07/07/22   Prosperi, Christian H, PA-C  ciprofloxacin-dexamethasone (CIPRODEX) OTIC suspension Place 4 drops into the right ear 2 (two) times daily. For 7 days 07/07/22   Prosperi, Christian H, PA-C  HYDROcodone-acetaminophen (NORCO) 5-325 MG tablet Take 1 tablet by mouth every 4 (four) hours as needed for moderate pain. 7/84/69   Delora Fuel, MD  methocarbamol (ROBAXIN) 500 MG tablet Take 1 tablet (500 mg total) by mouth every 8 (eight) hours as needed. Patient taking differently: Take 500 mg by mouth every 8 (eight) hours as needed for muscle spasms. 08/08/18   Petrucelli, Samantha R, PA-C  naproxen (NAPROSYN) 500 MG tablet Take 1 tablet (500 mg total) by mouth 2 (two) times daily. 08/08/18   Petrucelli, Samantha R, PA-C  ondansetron (ZOFRAN ODT) 4 MG disintegrating tablet Take 1 tablet (4 mg total) by mouth every 8 (eight) hours as needed for nausea or vomiting. 12/22/20   Wieters, Hallie C, PA-C       Allergies    Patient has no known allergies.    Review of Systems   Review of Systems  All other systems reviewed and are negative.   Physical Exam Updated Vital Signs BP (!) 154/91 (BP Location: Right Arm)   Pulse 79   Temp 98.4 F (36.9 C) (Oral)   Resp 18   Ht 5' (1.524 m)   Wt 88.5 kg   LMP 11/27/2022 (Approximate)   SpO2 99%   BMI 38.08 kg/m  Physical Exam Vitals and nursing note reviewed.  Constitutional:      General: She is not in acute distress.    Appearance: She is well-developed.  HENT:     Head: Atraumatic.     Nose: No congestion or rhinorrhea.     Mouth/Throat:     Mouth: Mucous membranes are moist. No oral lesions.     Pharynx: Uvula midline. No pharyngeal swelling, oropharyngeal exudate or posterior oropharyngeal erythema.     Tonsils: No tonsillar exudate or tonsillar abscesses.  Eyes:     Conjunctiva/sclera: Conjunctivae normal.  Cardiovascular:     Rate and Rhythm: Normal rate.  Pulmonary:     Effort: Pulmonary effort is normal.     Breath sounds: No wheezing, rhonchi or rales.  Abdominal:     Palpations: Abdomen is soft.  Musculoskeletal:     Cervical back: Neck supple.  Skin:    Findings: No rash.  Neurological:     Mental Status: She is alert.  Psychiatric:  Mood and Affect: Mood normal.     ED Results / Procedures / Treatments   Labs (all labs ordered are listed, but only abnormal results are displayed) Labs Reviewed  GROUP A STREP BY PCR    EKG None  Radiology No results found.  Procedures Procedures    Medications Ordered in ED Medications - No data to display  ED Course/ Medical Decision Making/ A&P                           Medical Decision Making  BP (!) 154/91 (BP Location: Right Arm)   Pulse 79   Temp 98.4 F (36.9 C) (Oral)   Resp 18   Ht 5' (1.524 m)   Wt 88.5 kg   LMP 11/27/2022 (Approximate)   SpO2 99%   BMI 38.08 kg/m   53:1 AM 47 year old female significant history of hepatitis  C, presenting complaint of sore throat.  Patient report for the past 3 days she has had cold symptoms which includes congestion, cough, and now sore throat.  Despite taking over-the-counter medication for symptoms still persist.  She does not endorse any fever or bodyaches.  She did mention several family members recently test positive for the flu.  Patient denies having vomiting or diarrhea.  On exam this is a well-appearing obese female appears to be in no acute discomfort.  She is able to speak in complete sentences with normal phonation.  Throat exam unremarkable uvula is midline no tonsillar enlargement or exudates no stridor or trismus.  Her lungs are clear to auscultation bilaterally without wheezes rales or rhonchi.  Suspect flulike symptoms causing her discomfort and less likely strep.  Strep test ordered.  10:44 AM Labs obtained and reviewed interpreted by me.  Group A strep is negative.  I have low suspicion for mononucleosis, peritonsillar abscess, retropharyngeal abscess, Ludwig angina or other acute oropharyngeal pathology.  47 y.o. female presenting with sore throat.  At this time, patient's clinical presentation is most consistent with influenza-like-illness.  As Pt is outside 48hr window of Sx onset, deferred screening for influenza.  The following were considered in the patient's differential diagnosis but was not deemed to be consistent with patients history of present illness and/or physical examination; meningitis, pharyngitis, otitis media, pneumonia, urinary tract infection, peritonsillar abscess, retropharyngeal abscess. Educated patient on diagnosis and course of influenza.  Supportive care and preventive measures were discussed.  Continue fluid hydration. Follow up with primary physician in 3-5 days if symptoms continue or new problems arise. Return to ED if high fever, altered mental status, shortness of breath, uncontrolled vomiting, or other  concers.  Impression: Influenza-like-illness   Plan:    * Discharge from ED   * Advised Pt on support therapies, including rest, advancement of fluids as tolerated, thorough handwashing w/ soap and H2O, taking OTC ibuprofen or acetaminophen as directed prn for F/C and myalgias, OTC expectorant/antitussive/decongestants as directed prn, and refraining from taking ASA.   * Advised Pt to refrain from visiting work, school, or daycares or visiting pregnant women, elderly, or those w/ chronic illnesses while febrile.   * Advised Pt to obtain annual influenza vaccine.   * Advised Pt to monitor for dyspnea, respiratory distress, worsening F/C, and signs of dehydration. Instructed Pt to f/up w/ PCP or ER should Sx worsen or not improve. Pt verbally expressed understanding and all questions were addressed to Pt's satisfaction.        Final Clinical Impression(s) /  ED Diagnoses Final diagnoses:  Viral illness    Rx / DC Orders ED Discharge Orders          Ordered    lidocaine (XYLOCAINE) 2 % solution  As needed        12/22/22 1046    acetaminophen (TYLENOL) 500 MG tablet  Every 8 hours PRN        12/22/22 1046              Fayrene Helper, PA-C 12/22/22 1047    Alvira Monday, MD 12/23/22 1436

## 2023-10-26 ENCOUNTER — Other Ambulatory Visit: Payer: Self-pay

## 2023-10-26 ENCOUNTER — Emergency Department (HOSPITAL_COMMUNITY)
Admission: EM | Admit: 2023-10-26 | Discharge: 2023-10-26 | Disposition: A | Discharge status: Home / Self Care | Payer: Commercial Managed Care - HMO | Attending: Emergency Medicine | Admitting: Emergency Medicine

## 2023-10-26 ENCOUNTER — Emergency Department (HOSPITAL_COMMUNITY): Payer: Commercial Managed Care - HMO

## 2023-10-26 ENCOUNTER — Encounter (HOSPITAL_COMMUNITY): Payer: Self-pay | Admitting: Pharmacy Technician

## 2023-10-26 DIAGNOSIS — J40 Bronchitis, not specified as acute or chronic: Secondary | ICD-10-CM | POA: Insufficient documentation

## 2023-10-26 DIAGNOSIS — R059 Cough, unspecified: Secondary | ICD-10-CM | POA: Diagnosis present

## 2023-10-26 DIAGNOSIS — J069 Acute upper respiratory infection, unspecified: Secondary | ICD-10-CM | POA: Insufficient documentation

## 2023-10-26 DIAGNOSIS — Z1152 Encounter for screening for COVID-19: Secondary | ICD-10-CM | POA: Diagnosis not present

## 2023-10-26 LAB — RESP PANEL BY RT-PCR (RSV, FLU A&B, COVID)  RVPGX2
Influenza A by PCR: NEGATIVE
Influenza B by PCR: NEGATIVE
Resp Syncytial Virus by PCR: NEGATIVE
SARS Coronavirus 2 by RT PCR: NEGATIVE

## 2023-10-26 MED ORDER — AZITHROMYCIN 250 MG PO TABS: 250.0000 mg | ORAL_TABLET | Freq: Every day | ORAL | 0 refills | Status: DC

## 2023-10-26 MED ORDER — PREDNISONE 20 MG PO TABS
60.0000 mg | ORAL_TABLET | Freq: Once | ORAL | Status: AC
  Administered 2023-10-26: 60 mg via ORAL
  Filled 2023-10-26: qty 3

## 2023-10-26 MED ORDER — PREDNISONE 20 MG PO TABS: 40.0000 mg | ORAL_TABLET | Freq: Every day | ORAL | 0 refills | Status: AC

## 2023-10-26 NOTE — ED Provider Notes (Signed)
[place] EMERGENCY DEPARTMENT AT [facility] Provider Note   CSN: [id number] Arrival date & time: 10/26/23  4098     History  Chief Complaint  Patient presents with   Cough    Tara May is a 47 y.o. female.  Patient here with cough congestion for the last few days maybe week.  Denies any medical problems.  Nothing makes it worse or better.  Denies any chest pain shortness of breath weakness numbness tingling.  No pain with urination.  The history is provided by the patient.       Home Medications Prior to Admission medications   Medication Sig Start Date End Date Taking? Authorizing Provider  azithromycin (ZITHROMAX) 250 MG tablet Take 1 tablet (250 mg total) by mouth daily. Take first 2 tablets together, then 1 every day until finished. 10/26/23  Yes Jayen Bromwell, DO  predniSONE (DELTASONE) 20 MG tablet Take 2 tablets (40 mg total) by mouth daily for 3 days. 10/26/23 10/29/23 Yes Willie Plain, DO  acetaminophen (TYLENOL) 500 MG tablet Take 2 tablets (1,000 mg total) by mouth every 8 (eight) hours as needed. 12/22/22   Fayrene Helper, PA-C  amoxicillin (AMOXIL) 500 MG capsule Take 1 capsule (500 mg total) by mouth 3 (three) times daily. 07/07/22   Prosperi, Christian H, PA-C  ciprofloxacin-dexamethasone (CIPRODEX) OTIC suspension Place 4 drops into the right ear 2 (two) times daily. For 7 days 07/07/22   Prosperi, Christian H, PA-C  HYDROcodone-acetaminophen (NORCO) 5-325 MG tablet Take 1 tablet by mouth every 4 (four) hours as needed for moderate pain. 08/12/18   Dione Booze, MD  lidocaine (XYLOCAINE) 2 % solution Use as directed 15 mLs in the mouth or throat as needed for mouth pain (sore throat). 12/22/22   Fayrene Helper, PA-C  methocarbamol (ROBAXIN) 500 MG tablet Take 1 tablet (500 mg total) by mouth every 8 (eight) hours as needed. Patient taking differently: Take 500 mg by mouth every 8 (eight) hours as needed for muscle spasms. 08/08/18   Petrucelli, Samantha  R, PA-C  naproxen (NAPROSYN) 500 MG tablet Take 1 tablet (500 mg total) by mouth 2 (two) times daily. 08/08/18   Petrucelli, Samantha R, PA-C  ondansetron (ZOFRAN ODT) 4 MG disintegrating tablet Take 1 tablet (4 mg total) by mouth every 8 (eight) hours as needed for nausea or vomiting. 12/22/20   Wieters, Hallie C, PA-C      Allergies    Patient has no known allergies.    Review of Systems   Review of Systems  Physical Exam Updated Vital Signs BP (!) 154/87 (BP Location: Right Arm) Comment: Simultaneous filing. User may not have seen previous data.  Pulse 86 Comment: Simultaneous filing. User may not have seen previous data.  Temp 98.2 F (36.8 C) (Oral) Comment: Simultaneous filing. User may not have seen previous data.  Resp 16 Comment: Simultaneous filing. User may not have seen previous data.  SpO2 100% Comment: Simultaneous filing. User may not have seen previous data. Physical Exam Vitals and nursing note reviewed.  Constitutional:      General: She is not in acute distress.    Appearance: She is well-developed.  HENT:     Head: Normocephalic and atraumatic.     Nose: Congestion present.  Eyes:     Extraocular Movements: Extraocular movements intact.     Conjunctiva/sclera: Conjunctivae normal.     Pupils: Pupils are equal, round, and reactive to light.  Cardiovascular:     Rate and Rhythm: Normal  rate and regular rhythm.     Heart sounds: No murmur heard. Pulmonary:     Effort: Pulmonary effort is normal. No respiratory distress.     Breath sounds: Normal breath sounds.  Abdominal:     Palpations: Abdomen is soft.     Tenderness: There is no abdominal tenderness.  Musculoskeletal:        General: No swelling.     Cervical back: Neck supple.  Skin:    General: Skin is warm and dry.     Capillary Refill: Capillary refill takes less than 2 seconds.  Neurological:     Mental Status: She is alert.  Psychiatric:        Mood and Affect: Mood normal.     ED Results /  Procedures / Treatments   Labs (all labs ordered are listed, but only abnormal results are displayed) Labs Reviewed  RESP PANEL BY RT-PCR (RSV, FLU A&B, COVID)  [id number]    EKG None  Radiology DG Chest Port 1 View  Result Date: 10/26/2023 CLINICAL DATA:  Productive cough. EXAM: PORTABLE CHEST 1 VIEW COMPARISON:  06/24/2017 FINDINGS: The cardiac silhouette, mediastinal and hilar contours are within normal limits given the AP projection and portable technique. The lungs are clear. No infiltrates or effusions. No pulmonary lesions. No pneumothorax. The bony thorax is intact. IMPRESSION: No acute cardiopulmonary findings. Electronically Signed   By: Rudie Meyer M.D.   On: 10/26/2023 10:08    Procedures Procedures    Medications Ordered in ED Medications  predniSONE (DELTASONE) tablet 60 mg (60 mg Oral Given 10/26/23 1022)    ED Course/ Medical Decision Making/ A&P                                 Medical Decision Making Amount and/or Complexity of Data Reviewed Radiology: ordered.  Risk Prescription drug management.   [name] is here with cough and congestion.  Normal vitals.  No fever.  No significant medical history.  Will get chest x-ray COVID and flu test.  Differential diagnosis likely bronchitis versus less likely pneumonia.  X-ray per my review interpretation shows no evidence of pneumonia or pneumothorax.  COVID and flu test unremarkable.  Will treat for bronchitis with steroids and Z-Pak.  Discharged in good condition.  This chart was dictated using voice recognition software.  Despite best efforts to proofread,  errors can occur which can change the documentation meaning.         Final Clinical Impression(s) / ED Diagnoses Final diagnoses:  Viral URI with cough  Bronchitis    Rx / DC Orders ED Discharge Orders          Ordered    azithromycin (ZITHROMAX) 250 MG tablet  Daily        10/26/23 1016    predniSONE (DELTASONE) 20 MG tablet  Daily         10/26/23 1016              [place], DO 10/26/23 1115

## 2023-10-26 NOTE — ED Triage Notes (Signed)
Pt here with reports of productive cough since getting her flu shot on the 24th. Has been taking otc meds without relief.

## 2023-11-09 ENCOUNTER — Encounter (HOSPITAL_BASED_OUTPATIENT_CLINIC_OR_DEPARTMENT_OTHER): Payer: Self-pay

## 2023-11-09 ENCOUNTER — Emergency Department (HOSPITAL_BASED_OUTPATIENT_CLINIC_OR_DEPARTMENT_OTHER): Payer: Commercial Managed Care - HMO | Admitting: Radiology

## 2023-11-09 ENCOUNTER — Emergency Department (HOSPITAL_BASED_OUTPATIENT_CLINIC_OR_DEPARTMENT_OTHER)
Admission: EM | Admit: 2023-11-09 | Discharge: 2023-11-09 | Disposition: A | Discharge status: Home / Self Care | Payer: Commercial Managed Care - HMO | Attending: Emergency Medicine | Admitting: Emergency Medicine

## 2023-11-09 ENCOUNTER — Other Ambulatory Visit: Payer: Self-pay

## 2023-11-09 DIAGNOSIS — R197 Diarrhea, unspecified: Secondary | ICD-10-CM | POA: Diagnosis not present

## 2023-11-09 DIAGNOSIS — R058 Other specified cough: Secondary | ICD-10-CM

## 2023-11-09 DIAGNOSIS — R059 Cough, unspecified: Secondary | ICD-10-CM | POA: Diagnosis present

## 2023-11-09 DIAGNOSIS — R112 Nausea with vomiting, unspecified: Secondary | ICD-10-CM | POA: Insufficient documentation

## 2023-11-09 DIAGNOSIS — G9331 Postviral fatigue syndrome: Secondary | ICD-10-CM | POA: Diagnosis not present

## 2023-11-09 LAB — URINALYSIS, ROUTINE W REFLEX MICROSCOPIC
Bilirubin Urine: NEGATIVE
Glucose, UA: NEGATIVE mg/dL
Hgb urine dipstick: NEGATIVE
Ketones, ur: NEGATIVE mg/dL
Leukocytes,Ua: NEGATIVE
Nitrite: NEGATIVE
Specific Gravity, Urine: 1.039 — ABNORMAL HIGH (ref 1.005–1.030)
pH: 6 (ref 5.0–8.0)

## 2023-11-09 LAB — LIPASE, BLOOD: Lipase: 21 U/L (ref 11–51)

## 2023-11-09 LAB — CBC WITH DIFFERENTIAL/PLATELET
Abs Immature Granulocytes: 0.05 10*3/uL (ref 0.00–0.07)
Basophils Absolute: 0.1 10*3/uL (ref 0.0–0.1)
Basophils Relative: 1 %
Eosinophils Absolute: 0.1 10*3/uL (ref 0.0–0.5)
Eosinophils Relative: 1 %
HCT: 36.7 % (ref 36.0–46.0)
Hemoglobin: 12.4 g/dL (ref 12.0–15.0)
Immature Granulocytes: 1 %
Lymphocytes Relative: 22 %
Lymphs Abs: 2.2 10*3/uL (ref 0.7–4.0)
MCH: 28.8 pg (ref 26.0–34.0)
MCHC: 33.8 g/dL (ref 30.0–36.0)
MCV: 85.2 fL (ref 80.0–100.0)
Monocytes Absolute: 0.5 10*3/uL (ref 0.1–1.0)
Monocytes Relative: 5 %
Neutro Abs: 7.1 10*3/uL (ref 1.7–7.7)
Neutrophils Relative %: 70 %
Platelets: 291 10*3/uL (ref 150–400)
RBC: 4.31 MIL/uL (ref 3.87–5.11)
RDW: 13.6 % (ref 11.5–15.5)
WBC: 10.1 10*3/uL (ref 4.0–10.5)
nRBC: 0 % (ref 0.0–0.2)

## 2023-11-09 LAB — COMPREHENSIVE METABOLIC PANEL
ALT: 16 U/L (ref 0–44)
AST: 14 U/L — ABNORMAL LOW (ref 15–41)
Albumin: 3.8 g/dL (ref 3.5–5.0)
Alkaline Phosphatase: 65 U/L (ref 38–126)
Anion gap: 10 (ref 5–15)
BUN: 13 mg/dL (ref 6–20)
CO2: 21 mmol/L — ABNORMAL LOW (ref 22–32)
Calcium: 8.1 mg/dL — ABNORMAL LOW (ref 8.9–10.3)
Chloride: 107 mmol/L (ref 98–111)
Creatinine, Ser: 0.58 mg/dL (ref 0.44–1.00)
GFR, Estimated: >60 mL/min (ref >60)
Glucose, Bld: 93 mg/dL (ref 70–99)
Potassium: 3.9 mmol/L (ref 3.5–5.1)
Sodium: 138 mmol/L (ref 135–145)
Total Bilirubin: 0.4 mg/dL (ref <1.2)
Total Protein: 6.7 g/dL (ref 6.5–8.1)

## 2023-11-09 LAB — PREGNANCY, URINE: Preg Test, Ur: NEGATIVE

## 2023-11-09 MED ORDER — ONDANSETRON 4 MG PO TBDP: 4.0000 mg | ORAL_TABLET | Freq: Three times a day (TID) | ORAL | 0 refills | Status: DC | PRN

## 2023-11-09 MED ORDER — METHYLPREDNISOLONE 4 MG PO TBPK: ORAL_TABLET | ORAL | 0 refills | Status: DC

## 2023-11-09 MED ORDER — ONDANSETRON HCL 4 MG/2ML IJ SOLN
4.0000 mg | Freq: Once | INTRAMUSCULAR | Status: AC
  Administered 2023-11-09: 4 mg via INTRAVENOUS
  Filled 2023-11-09: qty 2

## 2023-11-09 NOTE — ED Triage Notes (Signed)
In for eval of diarrhea and vomiting this am. Denies abd pain. Recent diag of bronchitis several weeks ago but continues to cough

## 2023-11-09 NOTE — ED Provider Notes (Signed)
[place] EMERGENCY DEPARTMENT AT [facility] Provider Note   CSN: [id number] Arrival date & time: 11/09/23  7829     History  Chief Complaint  Patient presents with   Diarrhea    Tara May is a 47 y.o. female.  Patient with noncontributory past medical history presents today with complaints of nausea, vomiting, and diarrhea.  She states that same began when she was at work this morning.  Denies any sick contacts.  No abdominal pain or urinary symptoms.  No hematemesis, hematochezia, or melena.  No history of similar symptoms previously.  Does also note that she was seen here 3 weeks ago for a cough and diagnosed with bronchitis and given azithromycin and prednisone which she has taken as prescribed in its entirety and is still having persistent symptoms.  Does note some soreness in her chest when she coughs.  Denies shortness of breath.  No fevers or chills.  The history is provided by the patient. No language interpreter was used.  Diarrhea Associated symptoms: vomiting        Home Medications Prior to Admission medications   Medication Sig Start Date End Date Taking? Authorizing Provider  acetaminophen (TYLENOL) 500 MG tablet Take 2 tablets (1,000 mg total) by mouth every 8 (eight) hours as needed. 12/22/22   Fayrene Helper, PA-C  amoxicillin (AMOXIL) 500 MG capsule Take 1 capsule (500 mg total) by mouth 3 (three) times daily. 07/07/22   Prosperi, Christian H, PA-C  azithromycin (ZITHROMAX) 250 MG tablet Take 1 tablet (250 mg total) by mouth daily. Take first 2 tablets together, then 1 every day until finished. 10/26/23   Curatolo, Adam, DO  ciprofloxacin-dexamethasone (CIPRODEX) OTIC suspension Place 4 drops into the right ear 2 (two) times daily. For 7 days 07/07/22   Prosperi, Christian H, PA-C  HYDROcodone-acetaminophen (NORCO) 5-325 MG tablet Take 1 tablet by mouth every 4 (four) hours as needed for moderate pain. 08/12/18   Dione Booze, MD  lidocaine (XYLOCAINE)  2 % solution Use as directed 15 mLs in the mouth or throat as needed for mouth pain (sore throat). 12/22/22   Fayrene Helper, PA-C  methocarbamol (ROBAXIN) 500 MG tablet Take 1 tablet (500 mg total) by mouth every 8 (eight) hours as needed. Patient taking differently: Take 500 mg by mouth every 8 (eight) hours as needed for muscle spasms. 08/08/18   Petrucelli, Samantha R, PA-C  naproxen (NAPROSYN) 500 MG tablet Take 1 tablet (500 mg total) by mouth 2 (two) times daily. 08/08/18   Petrucelli, Samantha R, PA-C  ondansetron (ZOFRAN ODT) 4 MG disintegrating tablet Take 1 tablet (4 mg total) by mouth every 8 (eight) hours as needed for nausea or vomiting. 12/22/20   Wieters, Hallie C, PA-C      Allergies    Patient has no known allergies.    Review of Systems   Review of Systems  HENT:  Positive for congestion.   Respiratory:  Positive for cough.   Gastrointestinal:  Positive for diarrhea and vomiting.  All other systems reviewed and are negative.   Physical Exam Updated Vital Signs BP (!) 148/97 (BP Location: Right Arm)   Pulse 82   Temp 98.5 F (36.9 C) (Oral)   Resp 16   Ht 5' (1.524 m)   Wt 90.7 kg   LMP 09/30/2023 (Approximate)   SpO2 100%   BMI 39.06 kg/m  Physical Exam Vitals and nursing note reviewed.  Constitutional:      General: She is not in acute  distress.    Appearance: Normal appearance. She is normal weight. She is not ill-appearing, toxic-appearing or diaphoretic.  HENT:     Head: Normocephalic and atraumatic.  Cardiovascular:     Rate and Rhythm: Normal rate and regular rhythm.     Heart sounds: Normal heart sounds.  Pulmonary:     Effort: Pulmonary effort is normal. No respiratory distress.     Breath sounds: Normal breath sounds.  Abdominal:     General: Abdomen is flat.     Palpations: Abdomen is soft.     Tenderness: There is no abdominal tenderness.  Musculoskeletal:        General: No tenderness. Normal range of motion.     Cervical back: Normal range of  motion.     Right lower leg: No edema.     Left lower leg: No edema.  Skin:    General: Skin is warm and dry.  Neurological:     General: No focal deficit present.     Mental Status: She is alert.  Psychiatric:        Mood and Affect: Mood normal.        Behavior: Behavior normal.     ED Results / Procedures / Treatments   Labs (all labs ordered are listed, but only abnormal results are displayed) Labs Reviewed  COMPREHENSIVE METABOLIC PANEL - Abnormal; Notable for the following components:      Result Value   CO2 21 (*)    Calcium 8.1 (*)    AST 14 (*)    All other components within normal limits  URINALYSIS, ROUTINE W REFLEX MICROSCOPIC - Abnormal; Notable for the following components:   Specific Gravity, Urine 1.039 (*)    Protein, ur TRACE (*)    Bacteria, UA RARE (*)    All other components within normal limits  LIPASE, BLOOD  CBC WITH DIFFERENTIAL/PLATELET  PREGNANCY, URINE    EKG None  Radiology DG Chest 2 View  Result Date: 11/09/2023 CLINICAL DATA:  Cough. EXAM: CHEST - 2 VIEW COMPARISON:  10/26/2023. FINDINGS: Bilateral lung fields are clear. Bilateral costophrenic angles are clear. Normal cardio-mediastinal silhouette. No acute osseous abnormalities. The soft tissues are within normal limits. IMPRESSION: *No active cardiopulmonary disease. Electronically Signed   By: Jules Schick M.D.   On: 11/09/2023 11:21    Procedures Procedures    Medications Ordered in ED Medications  ondansetron (ZOFRAN) injection 4 mg (4 mg Intravenous Given 11/09/23 1042)    ED Course/ Medical Decision Making/ A&P                                 Medical Decision Making Amount and/or Complexity of Data Reviewed Labs: ordered. Radiology: ordered.  Risk Prescription drug management.   This patient is a 47 y.o. female who presents to the ED for concern of cough x 2.5 weeks, nausea, vomiting, and diarrhea since this morning, this involves an extensive number of  treatment options, and is a complaint that carries with it a high risk of complications and morbidity. The emergent differential diagnosis prior to evaluation includes, but is not limited to,  URI, UTI, pneumonia, bronchitis, post-viral cough, electrolyte derangement . This is not an exhaustive differential.   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  Additional history: Chart reviewed. Pertinent results include: Seen for cough at [facility] on 11/10.  Had negative viral panel and clear chest x-ray, was diagnosed with bronchitis and treated with steroids and Z-Pak.  Physical Exam: Physical exam performed. The pertinent findings include: Well-appearing, abdomen soft and nontender.  No active vomiting.  Lung sounds clear to auscultation.  Lab Tests: I ordered, and personally interpreted labs.  The pertinent results include: No acute laboratory abnormalities   Imaging Studies: I ordered imaging studies including DG chest. I independently visualized and interpreted imaging which showed NAD. I agree with the radiologist interpretation.   Medications: I ordered medication including zofran  for nausea. Reevaluation of the patient after these medicines showed that the patient resolved. I have reviewed the patients home medicines and have made adjustments as needed.   Disposition: After consideration of the diagnostic results and the patients response to treatment, I feel that emergency department workup does not suggest an emergent condition requiring admission or immediate intervention beyond what has been performed at this time. The plan is: Discharge with close outpatient follow-up and return precautions.  Patient's workup is benign.  She has no abdominal pain.  After Zofran, her symptoms are completely controlled and she is able to eat and drink without any residual nausea or vomiting.  Symptoms likely due to  gastroenteritis, likely viral.  She has not had any diarrhea while she has been here either.  Will send home with prescription for Zofran, recommendations for increasing oral intake and close outpatient follow-up.  Patient also with cough that is likely postviral in nature.  She states she did have some improvement with the steroids that she was given, however after she takes continued these her symptoms returned.  Will send for a Medrol Dosepak.  She has no signs or symptoms to suggest ACS/PE. Evaluation and diagnostic testing in the emergency department does not suggest an emergent condition requiring admission or immediate intervention beyond what has been performed at this time.  Plan for discharge with close PCP follow-up.  Patient is understanding and amenable with plan, educated on red flag symptoms that would prompt immediate return.  Patient discharged in stable condition.   Final Clinical Impression(s) / ED Diagnoses Final diagnoses:  Post-viral cough syndrome  Nausea vomiting and diarrhea    Rx / DC Orders ED Discharge Orders          Ordered    ondansetron (ZOFRAN-ODT) 4 MG disintegrating tablet  Every 8 hours PRN        11/09/23 1316    methylPREDNISolone (MEDROL DOSEPAK) 4 MG TBPK tablet        11/09/23 1317          An After Visit Summary was printed and given to the patient.     Vear Clock 11/09/23 1319    Alvira Monday, MD 11/09/23 [phone]

## 2023-11-09 NOTE — Discharge Instructions (Signed)
As we discussed, your workup in the ER today was reassuring for acute findings.  Laboratory evaluation and chest x-ray did not reveal any emergent cause of your symptoms.  I suspected that you have something known as postviral cough syndrome which is causing your persistent cough.  This can be difficult to get rid of and can last for several weeks to months.  I have given you a prescription for a steroid pack for you to take as prescribed in its entirety to help with the symptoms.  I recommend that you follow-up closely with your primary doctor.  I have also given you a prescription for Zofran for the nausea and vomiting that you have had today.  Please take this as prescribed as needed.  Please be sure to maintain adequate oral intake with lots of fluids specifically those with electrolytes such as Gatorade or Pedialyte.  Return if development of any new or worsening symptoms.

## 2023-11-09 NOTE — ED Notes (Signed)
Pt alert and oriented X 4 at the time of discharge. RR even and unlabored. No acute distress noted. Pt verbalized understanding of discharge instructions as discussed. Pt provided the opportunity to asked questions.Pt ambulatory to lobby at time of discharge.

## 2024-03-29 ENCOUNTER — Encounter (HOSPITAL_BASED_OUTPATIENT_CLINIC_OR_DEPARTMENT_OTHER): Payer: Self-pay

## 2024-03-29 ENCOUNTER — Emergency Department (HOSPITAL_BASED_OUTPATIENT_CLINIC_OR_DEPARTMENT_OTHER)
Admission: EM | Admit: 2024-03-29 | Discharge: 2024-03-29 | Disposition: A | Discharge status: Home / Self Care | Attending: Emergency Medicine | Admitting: Emergency Medicine

## 2024-03-29 ENCOUNTER — Other Ambulatory Visit: Payer: Self-pay

## 2024-03-29 DIAGNOSIS — L0390 Cellulitis, unspecified: Secondary | ICD-10-CM | POA: Diagnosis not present

## 2024-03-29 DIAGNOSIS — L662 Folliculitis decalvans: Secondary | ICD-10-CM | POA: Diagnosis not present

## 2024-03-29 DIAGNOSIS — L559 Sunburn, unspecified: Secondary | ICD-10-CM | POA: Insufficient documentation

## 2024-03-29 DIAGNOSIS — L739 Follicular disorder, unspecified: Secondary | ICD-10-CM

## 2024-03-29 MED ORDER — DOXYCYCLINE HYCLATE 100 MG PO CAPS
100.0000 mg | ORAL_CAPSULE | Freq: Two times a day (BID) | ORAL | 0 refills | Status: DC
Start: 2024-03-29 — End: 2024-04-17

## 2024-03-29 NOTE — Discharge Instructions (Signed)
 Follow up with your doctor by Friday if not improving, return to the ER for worsening symptoms.  Take Doxycycline as prescribed and complete the full course. This medication will make you sun sensitive and at risk for further sunburn. Please use sunscreen regularly, wear light clothes and hat if you have to be outside.  Apply Eucerin or Cetaphil to skin regularly. This should help with itching and skin irritation.

## 2024-03-29 NOTE — ED Provider Notes (Signed)
 [place] EMERGENCY DEPARTMENT AT [facility] Provider Note   CSN: [id number] Arrival date & time: 03/29/24  1139     History  Chief Complaint  Patient presents with   Sunburn    Tara May is a 48 y.o. female.  48 year old female presents with concern for bad sunburn. Patient attended her grandson's soccer game 1 week ago and did not have time to apply sunscreen. Developed a sunburn to her chest, face, thighs. She has been taking Tylenol and applying aloe without improvement. Skin is itchy and tight, chest feels hot, she feels cold. Does not take any medications regularly.        Home Medications Prior to Admission medications   Medication Sig Start Date End Date Taking? Authorizing Provider  acetaminophen (TYLENOL) 500 MG tablet Take 2 tablets (1,000 mg total) by mouth every 8 (eight) hours as needed. 12/22/22   Fayrene Helper, PA-C  doxycycline (VIBRAMYCIN) 100 MG capsule Take 1 capsule (100 mg total) by mouth 2 (two) times daily. 03/29/24  Yes Jeannie Fend, PA-C      Allergies    Patient has no known allergies.    Review of Systems   Review of Systems Negative except as per HPI Physical Exam Updated Vital Signs BP (!) 156/89 (BP Location: Right Arm)   Pulse 86   Temp 98.1 F (36.7 C)   Resp 16   SpO2 98%  Physical Exam Vitals and nursing note reviewed.  Constitutional:      General: She is not in acute distress.    Appearance: She is well-developed. She is not diaphoretic.  HENT:     Head: Normocephalic and atraumatic.  Pulmonary:     Effort: Pulmonary effort is normal.  Skin:    General: Skin is warm and dry.     Findings: Erythema and rash present.     Comments: Upper chest area is erythematous with pustules, pustules extend to breasts and in between breast area. Mild erythema across forehead also with pustules.  Neurological:     Mental Status: She is alert and oriented to person, place, and time.  Psychiatric:        Behavior: Behavior  normal.     ED Results / Procedures / Treatments   Labs (all labs ordered are listed, but only abnormal results are displayed) Labs Reviewed - No data to display  EKG None  Radiology No results found.  Procedures Procedures    Medications Ordered in ED Medications - No data to display  ED Course/ Medical Decision Making/ A&P                                 Medical Decision Making Risk Prescription drug management.   This patient presents to the ED for concern of sunburn, this involves an extensive number of treatment options, and is a complaint that carries with it a high risk of complications and morbidity.  The differential diagnosis includes but limited to burn, secondary infection including cellulitis/folliculitis   Co morbidities that complicate the patient evaluation  Per chart review, history of chronic hep C   Additional history obtained:  External records from outside source obtained and reviewed including no recent visits on file.   Problem List / ED Course / Critical interventions / Medication management  48 year old female presents with concern for sunburn.  Appears to have sunburn across chest and forehead area, question secondary infection with  development of pustules, possible underlying cellulitis.  Will treat with doxycycline.  Advised patient to avoid sun due to sun sensitive medication and proper skin protection.  Also recommend applying Eucerin or Cetaphil to skin.  Return to ER for worsening symptoms otherwise follow-up with PCP if not improving by Friday. I have reviewed the patients home medicines and have made adjustments as needed   Social Determinants of Health:  No PCP on file   Test / Admission - Considered:  Stable for discharge with plan to return as needed, follow-up with PCP by Friday if not improved         Final Clinical Impression(s) / ED Diagnoses Final diagnoses:  Sunburn  Folliculitis  Cellulitis, unspecified  cellulitis site    Rx / DC Orders ED Discharge Orders          Ordered    doxycycline (VIBRAMYCIN) 100 MG capsule  2 times daily        03/29/24 1223              Darlis Eisenmenger, PA-C 03/29/24 1234    Afton Horse T, DO 04/01/24 [phone]

## 2024-03-29 NOTE — ED Triage Notes (Signed)
 Pt states she was at football game last week, advises "a lot of sun that day, & I was rushed so I didn't get sun protectant on." Pt w redness on chest, L side of face. Advises aloe & OTC remedies "haven't helped."  Associated itching, "feels like my skin is going to pull apart."

## 2024-03-31 ENCOUNTER — Emergency Department (HOSPITAL_BASED_OUTPATIENT_CLINIC_OR_DEPARTMENT_OTHER)
Admission: EM | Admit: 2024-03-31 | Discharge: 2024-03-31 | Disposition: A | Discharge status: Home / Self Care | Attending: Emergency Medicine | Admitting: Emergency Medicine

## 2024-03-31 ENCOUNTER — Other Ambulatory Visit: Payer: Self-pay

## 2024-03-31 ENCOUNTER — Encounter (HOSPITAL_BASED_OUTPATIENT_CLINIC_OR_DEPARTMENT_OTHER): Payer: Self-pay

## 2024-03-31 DIAGNOSIS — L03211 Cellulitis of face: Secondary | ICD-10-CM | POA: Insufficient documentation

## 2024-03-31 DIAGNOSIS — R21 Rash and other nonspecific skin eruption: Secondary | ICD-10-CM | POA: Diagnosis present

## 2024-03-31 LAB — CBC WITH DIFFERENTIAL/PLATELET
Abs Immature Granulocytes: 0.05 10*3/uL (ref 0.00–0.07)
Basophils Absolute: 0.1 10*3/uL (ref 0.0–0.1)
Basophils Relative: 1 %
Eosinophils Absolute: 0 10*3/uL (ref 0.0–0.5)
Eosinophils Relative: 1 %
HCT: 44.9 % (ref 36.0–46.0)
Hemoglobin: 15 g/dL (ref 12.0–15.0)
Immature Granulocytes: 1 %
Lymphocytes Relative: 23 %
Lymphs Abs: 1.8 10*3/uL (ref 0.7–4.0)
MCH: 28.2 pg (ref 26.0–34.0)
MCHC: 33.4 g/dL (ref 30.0–36.0)
MCV: 84.6 fL (ref 80.0–100.0)
Monocytes Absolute: 0.6 10*3/uL (ref 0.1–1.0)
Monocytes Relative: 8 %
Neutro Abs: 5.2 10*3/uL (ref 1.7–7.7)
Neutrophils Relative %: 66 %
Platelets: 293 10*3/uL (ref 150–400)
RBC: 5.31 MIL/uL — ABNORMAL HIGH (ref 3.87–5.11)
RDW: 13.3 % (ref 11.5–15.5)
WBC: 7.8 10*3/uL (ref 4.0–10.5)
nRBC: 0 % (ref 0.0–0.2)

## 2024-03-31 LAB — COMPREHENSIVE METABOLIC PANEL WITH GFR
ALT: 13 U/L (ref 0–44)
AST: 22 U/L (ref 15–41)
Albumin: 4.1 g/dL (ref 3.5–5.0)
Alkaline Phosphatase: 66 U/L (ref 38–126)
Anion gap: 11 (ref 5–15)
BUN: 14 mg/dL (ref 6–20)
CO2: 23 mmol/L (ref 22–32)
Calcium: 9.3 mg/dL (ref 8.9–10.3)
Chloride: 101 mmol/L (ref 98–111)
Creatinine, Ser: 0.8 mg/dL (ref 0.44–1.00)
GFR, Estimated: >60 mL/min (ref >60)
Glucose, Bld: 80 mg/dL (ref 70–99)
Potassium: 4.3 mmol/L (ref 3.5–5.1)
Sodium: 135 mmol/L (ref 135–145)
Total Bilirubin: 0.3 mg/dL (ref 0.0–1.2)
Total Protein: 7.9 g/dL (ref 6.5–8.1)

## 2024-03-31 LAB — SEDIMENTATION RATE: Sed Rate: 31 mm/h — ABNORMAL HIGH (ref 0–22)

## 2024-03-31 LAB — C-REACTIVE PROTEIN: CRP: 4.6 mg/dL — ABNORMAL HIGH (ref <1.0)

## 2024-03-31 MED ORDER — PREDNISONE 10 MG PO TABS: 50.0000 mg | ORAL_TABLET | Freq: Every day | ORAL | 0 refills | Status: AC

## 2024-03-31 MED ORDER — DEXAMETHASONE SODIUM PHOSPHATE 10 MG/ML IJ SOLN
10.0000 mg | Freq: Once | INTRAMUSCULAR | Status: AC
  Administered 2024-03-31: 10 mg via INTRAVENOUS
  Filled 2024-03-31: qty 1

## 2024-03-31 MED ORDER — DEXTROSE 5 % IV SOLN
1500.0000 mg | Freq: Once | INTRAVENOUS | Status: AC
  Administered 2024-03-31: 1500 mg via INTRAVENOUS
  Filled 2024-03-31: qty 75

## 2024-03-31 NOTE — ED Notes (Signed)
 Kayleen Party with pharmacy notified this nurse of IV ABX medication being made at [facility] and to be sent here via courier.

## 2024-03-31 NOTE — Discharge Instructions (Addendum)
 You were seen in the emergency room for facial rash.  You received IV antibiotics -which should cover for common skin pathogen. Take the steroid prescription starting tomorrow.  If your symptoms are worsening, please return to ER.  I've attached the contact information for the dermatology office in [place]. They hope to see you in the next 1-2 weeks. Their office should call your phone number to schedule an appointment in the next few days.

## 2024-03-31 NOTE — ED Notes (Signed)
 Spoke with pharmacy at [facility] again. Medication still not received. Pharmacy calling courier to bring to ED-main entrance.

## 2024-03-31 NOTE — Progress Notes (Signed)
 Pharmacy Note:  [facility] for Acute Bacterial Skin and Skin Structure Infection (ABSSSI) Patients to [facility] Discharge Tara May is an 48 y.o. female who presented to [facility] on 03/31/2024 with an Acute Bacterial Skin and Skin Structure Infection  Inclusion criteria - Indication [x]  Moderately large skin lesion (>=75 cm2 or larger - about the size of a baseball) [x]  Cellulitis  Inclusion Criteria - at least one SIRS criteria present []  WBC > 12,000 or < 4000 []  temp >100.9 or < 96.8 [x]  heart rate >90[]  respiratory rate >20  Patient was evaluated for the following exclusion criteria and no exclusions were found  Hardware involvement, Hypotension / shock, Elevated lactate (>2) without other explanation, gram-negative infection risk factors (bites, water exposure, infection after trauma, infection after skin graft, neutropenia, burns, severe immunocompromise), necrotizing fasciitis possible or confirmed, Known or suspected osteomyelitis or septic arthritis, endocarditis, diabetic foot infection, ischemic ulcers, post-operative wound infection, perirectal infections, need for drainage in the operating room, hand or facial infections, injection drug users with a fever, bacteremia, pregnancy or breastfeeding, allergy to related antibiotics like vancomycin, known liver disease (t.bili >2x ULN or AST/ALT 3x ULN)  Alf Doyle, Kathlene Paradise 03/31/2024, 2:47 PM Clinical Pharmacist

## 2024-03-31 NOTE — ED Triage Notes (Signed)
 Patient arrives with complaints of worsening rash to her face & chest x12 days. Patient was seen here recently for the same but the rash has worsened. No relief with prescription medication.

## 2024-03-31 NOTE — ED Notes (Signed)
 [place] [phone]

## 2024-03-31 NOTE — ED Provider Notes (Signed)
 [place] EMERGENCY DEPARTMENT AT [facility] Provider Note   CSN: [id number] Arrival date & time: 03/31/24  1232     History  Chief Complaint  Patient presents with   Rash   Fever    Tara May is a 48 y.o. female.  HPI    48 year old female comes in with chief complaint of rash and fever.  Patient says about 10 days ago, she had gone to a football game.  She was outside for more than 6 hours.  The next day, she had sunburn.  A day after that, she started noticing increasing redness to her face and upper part of her chest.  Soon after that, she started noticing some spots all over her skin over her face and upper part of her neck.  She tried to take care of it as she would with any other sunburn wounds -however her symptoms did not improve.  Patient came to the ER few days back.  She started taking doxycycline , but her symptoms are worsened.  Over the course of last few days, the skin has become thicker over her face and she is noticing some swelling to the left side of her neck.  Patient has history of hepatitis C.  She denies any history of cancer.  She has no known history of any skin condition, skin allergies, autoimmune condition.  She denies any jewelry use or recent new skin products.  She denies any travel history or sick contacts or any URI-like symptoms.  Patient is now having subjective fevers.  Her Tmax at home was 99.8.  Home Medications Prior to Admission medications   Medication Sig Start Date End Date Taking? Authorizing Provider  predniSONE  (DELTASONE ) 10 MG tablet Take 5 tablets (50 mg total) by mouth daily. 03/31/24  Yes Deatra Face, MD  acetaminophen  (TYLENOL ) 500 MG tablet Take 2 tablets (1,000 mg total) by mouth every 8 (eight) hours as needed. 12/22/22   Debbra Fairy, PA-C  doxycycline  (VIBRAMYCIN ) 100 MG capsule Take 1 capsule (100 mg total) by mouth 2 (two) times daily. 03/29/24   Darlis Eisenmenger, PA-C      Allergies    Patient has no known  allergies.    Review of Systems   Review of Systems  All other systems reviewed and are negative.   Physical Exam Updated Vital Signs BP (!) 179/90 (BP Location: Right Arm)   Pulse (!) 105   Temp 99.6 F (37.6 C) (Oral)   Resp 20   Ht 5' (1.524 m)   Wt 90.7 kg   SpO2 100%   BMI 39.05 kg/m  Physical Exam Vitals and nursing note reviewed.  Constitutional:      Appearance: She is well-developed.  HENT:     Head: Normocephalic and atraumatic.  Eyes:     Extraocular Movements: Extraocular movements intact.  Cardiovascular:     Rate and Rhythm: Normal rate.  Pulmonary:     Effort: Pulmonary effort is normal.  Musculoskeletal:     Cervical back: Normal range of motion and neck supple.  Skin:    General: Skin is dry.     Findings: Erythema and rash present.     Comments: Patient has erythematous and hypertrophied skin to the face and upper part of her neck.  Left face appears asymmetrically slightly swollen.  Positive cervical lymphadenopathy. She has confluence of papular rash, no fluid-filled vesicles or blisters noted.  There is some crusting and honeycombing on some of the lesions.  Neurological:  Mental Status: She is alert and oriented to person, place, and time.          ED Results / Procedures / Treatments   Labs (all labs ordered are listed, but only abnormal results are displayed) Labs Reviewed  CBC WITH DIFFERENTIAL/PLATELET - Abnormal; Notable for the following components:      Result Value   RBC 5.31 (*)    All other components within normal limits  COMPREHENSIVE METABOLIC PANEL WITH GFR  SEDIMENTATION RATE  C-REACTIVE PROTEIN    EKG None  Radiology No results found.  Procedures Procedures    Medications Ordered in ED Medications  dalbavancin (DALVANCE ) 1,500 mg in dextrose  5 % 500 mL IVPB (has no administration in time range)  dexamethasone  (DECADRON ) injection 10 mg (has no administration in time range)    ED Course/ Medical  Decision Making/ A&P Clinical Course as of 04/02/24 2350  Wed Mar 31, 2024  1549 10 day rash, face and neck, more swelling Papular lesions Started on doxy 3 days ago, no response Infectious disease doesn't think it's infected but recommended dalvance  and derm follow up if no better [LR]    Clinical Course User Index [LR] Roemhildt, Anna Kettering, PA-C                                 Medical Decision Making Amount and/or Complexity of Data Reviewed Labs: ordered.  Risk Prescription drug management.   48 year old female comes into the emergency room with chief complaint of worsening rash. She has history of hepatitis, which she has been treated for.  Currently not on any immunosuppressive agents.  She has no known skin disease or allergies or any new exposures.  No recent illnesses or travels.  Differential diagnosis for her lesion includes infectious etiology such as impetigo, staphylococcal scalded syndrome, folliculitis, cellulitis.  Other possibilities include autoimmune conditions, vasculitis.  Low suspicion for allergic reaction at this time.  Clinically, not consistent with sun toxicity from Doxy use.  Long extensive discussion with the patient.  Plan is to get basic labs right now. We will give her 1 dose of IV Dalvance .  We will also put her on prednisone .  If her symptoms worsen, she has been advised to return to the ER.  I did consult with infectious disease doctor. Dr. Danetta Dunnings reviewed the images with me.  He is unsure if the symptoms are infectious in nature given the location and the symmetry that we see.  Unfortunately, there is no rheumatology or dermatology who can be of further assistance with this case here.  He suggested Dalvance  as a possible solution for this patient to cover for infection.  Patient agreed with the recommendation from ID.  IV Dalvance  now.  Oral steroids also will be prescribed in addition to IV dexamethasone  here.  She will apply Eucerin cream to the  face as needed.  She is aware that we do not have specialty service that might need to assist with the diagnosis if she does not respond to Dalvance .  Patient therefore might prefer going to tertiary center like [facility].   Final Clinical Impression(s) / ED Diagnoses Final diagnoses:  Facial cellulitis  Facial rash    Rx / DC Orders ED Discharge Orders          Ordered    Ambulatory referral to Infectious Disease       Comments: Cellulitis patient:  Received dalbavancin on 03/31/2024.  03/31/24 1446    predniSONE  (DELTASONE ) 10 MG tablet  Daily        03/31/24 1534              Deatra Face, MD 04/02/24 2350

## 2024-03-31 NOTE — ED Notes (Signed)
 RN reviewed discharge instructions with pt. Pt verbalized understanding and had no further questions. VSS upon discharge.

## 2024-03-31 NOTE — ED Notes (Signed)
 Spoke with pharmacy at [facility]. Courier picked up medication around 1605. Will give to patient once received.

## 2024-04-15 ENCOUNTER — Ambulatory Visit (INDEPENDENT_AMBULATORY_CARE_PROVIDER_SITE_OTHER): Admitting: Infectious Diseases

## 2024-04-15 ENCOUNTER — Other Ambulatory Visit: Payer: Self-pay

## 2024-04-15 ENCOUNTER — Encounter: Payer: Self-pay | Admitting: Infectious Diseases

## 2024-04-15 VITALS — BP 135/91 | HR 83 | Temp 98.5°F | Ht 60.0 in | Wt 205.0 lb

## 2024-04-15 DIAGNOSIS — B182 Chronic viral hepatitis C: Secondary | ICD-10-CM

## 2024-04-15 DIAGNOSIS — Z1159 Encounter for screening for other viral diseases: Secondary | ICD-10-CM | POA: Diagnosis not present

## 2024-04-15 DIAGNOSIS — Z79899 Other long term (current) drug therapy: Secondary | ICD-10-CM | POA: Diagnosis not present

## 2024-04-15 DIAGNOSIS — R21 Rash and other nonspecific skin eruption: Secondary | ICD-10-CM

## 2024-04-15 DIAGNOSIS — Z114 Encounter for screening for human immunodeficiency virus [HIV]: Secondary | ICD-10-CM | POA: Insufficient documentation

## 2024-04-15 NOTE — Progress Notes (Unsigned)
 Patient Active Problem List   Diagnosis Date Noted   Acute otitis externa of right ear 12/18/2017   Eustachian tube dysfunction, bilateral 12/18/2017   Current Outpatient Medications on File Prior to Visit  Medication Sig Dispense Refill   acetaminophen  (TYLENOL ) 500 MG tablet Take 2 tablets (1,000 mg total) by mouth every 8 (eight) hours as needed. 30 tablet 0   predniSONE  (DELTASONE ) 10 MG tablet Take 5 tablets (50 mg total) by mouth daily. 25 tablet 0   silver sulfADIAZINE (SILVADENE) 1 % cream Apply to the affected areas of the skin of the face and body twice daily.     No current facility-administered medications on file prior to visit.    Subjective: Discussed the use of AI scribe software for clinical note transcription with the patient, who gave verbal consent to proceed.   48 year old female with h/o HCV s/p treatment per patient report who is referred by EDP after recent ED visit.   She had a bad sunburn when she went to watch her grandson's soccer game 3-4 weeks ago. Rashes were mainly in the anterior chest and b/l lateral neck and cheeks with no other areas involved. Was taking tylenol  and aloe vera gel at home. Seen in the ED on 4/14 with concerns for secondary cellulitis, was prescribed PO doxycycline . It got worse despite of doxycycline  the next 2 day and went to ED 4/16. She received one dose of dalbavancin in the ED after discussion with ID and was referred to dermatology as well as ID. She was also given IV dexamethasone  and prednisone  PO as well as Eucerin cream.   Seen at Dermatology 4/24 and had biopsy done. She was prescribed prednisone  and a topical cream, but reports rash was already clearing up before that appt.  She continues prednisone  and cream application.   Denies fever, chills, oral sores, ulcers, nausea, vomiting, abdominal pain, diarrhea, genito-urinary problems, chest pain, cough, shortness of breath, joint pain, or recent weight changes.  She has been  with her husband since 2015. Adopted. Denies smoking alcohol and recreational drugs. Treated for HCV many years ago and was told to be cured.   Review of Systems: all systems reviewed with pertinent positives and negatives as listed above  Past Medical History:  Diagnosis Date   Hep C w/o coma, chronic (HCC)    Past Surgical History:  Procedure Laterality Date   TUBAL LIGATION     Social History   Tobacco Use   Smoking status: Never   Smokeless tobacco: Never  Vaping Use   Vaping status: Never Used  Substance Use Topics   Alcohol use: No   Drug use: No    Family History  Adopted: Yes    No Known Allergies  Health Maintenance  Topic Date Due   Hepatitis C Screening  Never done   Cervical Cancer Screening (HPV/Pap Cotest)  Never done   Colonoscopy  Never done   COVID-19 Vaccine (1 - 2024-25 season) Never done   INFLUENZA VACCINE  07/16/2024   DTaP/Tdap/Td (2 - Td or Tdap) 08/08/2028   HIV Screening  Completed   HPV VACCINES  Aged Out   Meningococcal B Vaccine  Aged Out    Objective: BP (!) 135/91   Pulse 83   Temp 98.5 F (36.9 C) (Temporal)   Ht 5' (1.524 m)   Wt 205 lb (93 kg)   SpO2 95%   BMI 40.04 kg/m    Physical Exam Constitutional:  Appearance: Normal appearance. Morbid Obesity  HENT:     Head: Normocephalic and atraumatic.      Mouth: Mucous membranes are moist.  Eyes:    Conjunctiva/sclera: Conjunctivae normal.     Pupils: Pupils are equal, round, and b/l symmetrical  Cardiovascular:     Rate and Rhythm: Normal rate and regular rhythm.     Heart sounds: s1s2  Pulmonary:     Effort: Pulmonary effort is normal.     Breath sounds: Normal breath sounds.   Abdominal:     General: Non distended     Palpations: soft.   Musculoskeletal:        General: Normal range of motion.   Skin:( chaperoned)    General: Skin is warm and dry.     Comments: rashes in the b/l cheek, neck and anterior chest clearing up, not worsening, no signs of  infection            Neurological:     General: grossly non focal     Mental Status: awake, alert and oriented to person, place, and time.   Psychiatric:        Mood and Affect: Mood normal.   Lab Results Lab Results  Component Value Date   WBC 7.8 03/31/2024   HGB 15.0 03/31/2024   HCT 44.9 03/31/2024   MCV 84.6 03/31/2024   PLT 293 03/31/2024    Lab Results  Component Value Date   CREATININE 0.80 03/31/2024   BUN 14 03/31/2024   NA 135 03/31/2024   K 4.3 03/31/2024   CL 101 03/31/2024   CO2 23 03/31/2024    Lab Results  Component Value Date   ALT 13 03/31/2024   AST 22 03/31/2024   ALKPHOS 66 03/31/2024   BILITOT 0.3 03/31/2024    No results found for: "CHOL", "HDL", "LDLCALC", "LDLDIRECT", "TRIG", "CHOLHDL" Lab Results  Component Value Date   LABRPR Non Reactive 07/13/2018   No results found for: "HIV1RNAQUANT", "HIV1RNAVL", "CD4TABS"  Pathology  04/08/24 Final Diagnosis   A. SKIN, RIGHT FOOT, BIOPSY:               SUBACUTE SPONGIOTIC DERMATITIS (SEE COMMENT)   Comment: There is spongiosis and a perivascular inflammatory infiltrate. allergic contact, nummular and other spongiotic processes enter the differential.    B. 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 Professional time spent includes the following activities: Preparing to see the patient (review of tests), Obtaining and/or reviewing separately obtained history (admission/discharge record), Performing a medically appropriate examination and/or evaluation , Ordering medications/tests/procedures, referring and communicating with other health care professionals, Documenting clinical information in the EMR, Independently interpreting results (not separately  reported), Communicating results to the patient/family/caregiver, Counseling and educating the patient/family/caregiver and Care coordination (not separately reported).   Of note, portions of this note may have been created with voice recognition software. While this note has been edited for accuracy, occasional wrong-word or 'sound-a-like' substitutions may have occurred due to the inherent limitations of voice recognition software.   Melvina Stage, MD [facility] 04/15/2024, 1:20 PM

## 2024-04-18 LAB — HEPATITIS B CORE ANTIBODY, TOTAL: Hep B Core Total Ab: NONREACTIVE

## 2024-04-18 LAB — HEPATITIS C RNA QUANTITATIVE
HCV Quantitative Log: <1.18 {Log_IU}/mL
HCV RNA, PCR, QN: <15 [IU]/mL

## 2024-04-18 LAB — HEPATITIS B SURFACE ANTIGEN: Hepatitis B Surface Ag: NONREACTIVE

## 2024-04-18 LAB — HEPATITIS B SURFACE ANTIBODY,QUALITATIVE: Hep B S Ab: REACTIVE — AB

## 2024-05-18 ENCOUNTER — Ambulatory Visit: Payer: PRIVATE HEALTH INSURANCE | Admitting: Nurse Practitioner

## 2024-12-06 ENCOUNTER — Emergency Department (HOSPITAL_BASED_OUTPATIENT_CLINIC_OR_DEPARTMENT_OTHER)
Admission: EM | Admit: 2024-12-06 | Discharge: 2024-12-06 | Disposition: A | Discharge status: Home / Self Care | Attending: Emergency Medicine | Admitting: Emergency Medicine

## 2024-12-06 ENCOUNTER — Encounter (HOSPITAL_BASED_OUTPATIENT_CLINIC_OR_DEPARTMENT_OTHER): Payer: Self-pay

## 2024-12-06 ENCOUNTER — Emergency Department (HOSPITAL_BASED_OUTPATIENT_CLINIC_OR_DEPARTMENT_OTHER): Admitting: Radiology

## 2024-12-06 ENCOUNTER — Other Ambulatory Visit: Payer: Self-pay

## 2024-12-06 DIAGNOSIS — B974 Respiratory syncytial virus as the cause of diseases classified elsewhere: Secondary | ICD-10-CM | POA: Insufficient documentation

## 2024-12-06 DIAGNOSIS — R519 Headache, unspecified: Secondary | ICD-10-CM | POA: Diagnosis not present

## 2024-12-06 DIAGNOSIS — M791 Myalgia, unspecified site: Secondary | ICD-10-CM | POA: Diagnosis not present

## 2024-12-06 DIAGNOSIS — R059 Cough, unspecified: Secondary | ICD-10-CM | POA: Insufficient documentation

## 2024-12-06 DIAGNOSIS — B338 Other specified viral diseases: Secondary | ICD-10-CM

## 2024-12-06 LAB — RESP PANEL BY RT-PCR (RSV, FLU A&B, COVID)  RVPGX2
Influenza A by PCR: NEGATIVE
Influenza B by PCR: NEGATIVE
Resp Syncytial Virus by PCR: POSITIVE — AB
SARS Coronavirus 2 by RT PCR: NEGATIVE

## 2024-12-06 MED ORDER — BENZONATATE 100 MG PO CAPS: 100.0000 mg | ORAL_CAPSULE | Freq: Three times a day (TID) | ORAL | 0 refills | Status: AC

## 2024-12-06 MED ORDER — GUAIFENESIN-CODEINE 100-10 MG/5ML PO SOLN: 5.0000 mL | Freq: Three times a day (TID) | ORAL | 0 refills | Status: AC | PRN

## 2024-12-06 NOTE — ED Triage Notes (Signed)
 Pt c/o persistent cough, HA, rib cage pain. Symptoms x1wk. Grandson w RSV approx 1.5wk ago

## 2024-12-06 NOTE — Discharge Instructions (Addendum)
 It was a pleasure taking care of you here today  Your workup showed positive for RSV.  Your chest x-ray and EKG did not show any significant abnormality  I wrote you for a few medications to help with your cough.  Continue to take the antihistamine at home such as Zyrtec  as well as Flonase, use humidifier and may also get over-the-counter Vicks.  Please use caution as the liquid cough medicine does contain a controlled substance and may make you sleepy.  Follow-up outpatient, return for any worsening symptoms

## 2024-12-06 NOTE — ED Provider Notes (Signed)
" °  [place] EMERGENCY DEPARTMENT AT [facility] Provider Note   CSN: [id number] Arrival date & time: 12/06/24  1517     Patient presents with: URI   Tara May is a 48 y.o. female.  {Add pertinent medical, surgical, social history, OB history to HPI:32947} HPI     Prior to Admission medications  Medication Sig Start Date End Date Taking? Authorizing Provider  acetaminophen  (TYLENOL ) 500 MG tablet Take 2 tablets (1,000 mg total) by mouth every 8 (eight) hours as needed. 12/22/22   Nivia Colon, PA-C  predniSONE  (DELTASONE ) 10 MG tablet Take 5 tablets (50 mg total) by mouth daily. 03/31/24   Charlyn Sora, MD  silver sulfADIAZINE (SILVADENE) 1 % cream Apply to the affected areas of the skin of the face and body twice daily. 04/08/24   [provider]    Allergies: Patient has no known allergies.    Review of Systems  Updated Vital Signs BP (!) 140/90   Pulse 81   Temp 98.9 F (37.2 C)   Resp 16   SpO2 97%   Physical Exam  (all labs ordered are listed, but only abnormal results are displayed) Labs Reviewed  RESP PANEL BY RT-PCR (RSV, FLU A&B, COVID)  RVPGX2 - Abnormal; Notable for the following components:      Result Value   Resp Syncytial Virus by PCR POSITIVE (*)    All other components within normal limits    EKG: None  Radiology: DG Chest 2 View Result Date: 12/06/2024 CLINICAL DATA:  Cough and rib pain. EXAM: CHEST - 2 VIEW COMPARISON:  November 09, 2023 FINDINGS: The heart size and mediastinal contours are within normal limits. Both lungs are clear. The visualized skeletal structures are unremarkable. IMPRESSION: No active cardiopulmonary disease. Electronically Signed   By: Suzen Dials M.D.   On: 12/06/2024 17:13    {Document cardiac monitor, telemetry assessment procedure when appropriate:32947} Procedures   Medications Ordered in the ED - No data to display  Clinical Course as of 12/06/24 2016  Mon Dec 06, 2024  1941  [employer] [place] [BH]    Clinical Course User Index [BH] Cady Hafen A, PA-C   {Click here for ABCD2, HEART and other calculators REFRESH Note before signing:1}                              Medical Decision Making Amount and/or Complexity of Data Reviewed Radiology: ordered.   ***  {Document critical care time when appropriate  Document review of labs and clinical decision tools ie CHADS2VASC2, etc  Document your independent review of radiology images and any outside records  Document your discussion with family members, caretakers and with consultants  Document social determinants of health affecting pt's care  Document your decision making why or why not admission, treatments were needed:32947:::1}   Final diagnoses:  None    ED Discharge Orders     None        "
